# Patient Record
Sex: Female | Born: 1990 | Race: White | Hispanic: No | Marital: Married | Smoking: Never smoker
Health system: Southern US, Community
[De-identification: ages and names within clinical notes are randomized; demographics above are authoritative.]

## PROBLEM LIST (undated history)

## (undated) HISTORY — PX: NO PAST SURGERIES: SHX2092

## (undated) HISTORY — PX: LEEP: SHX91

---

## 2014-09-18 HISTORY — PX: LEEP: SHX91

## 2018-06-19 ENCOUNTER — Emergency Department: Payer: BLUE CROSS/BLUE SHIELD

## 2018-06-19 ENCOUNTER — Other Ambulatory Visit: Payer: Self-pay

## 2018-06-19 ENCOUNTER — Emergency Department
Admission: EM | Admit: 2018-06-19 | Discharge: 2018-06-19 | Disposition: A | Discharge status: Home / Self Care | Payer: BLUE CROSS/BLUE SHIELD | Attending: Emergency Medicine | Admitting: Emergency Medicine

## 2018-06-19 DIAGNOSIS — R109 Unspecified abdominal pain: Secondary | ICD-10-CM | POA: Diagnosis present

## 2018-06-19 DIAGNOSIS — A09 Infectious gastroenteritis and colitis, unspecified: Secondary | ICD-10-CM | POA: Insufficient documentation

## 2018-06-19 LAB — COMPREHENSIVE METABOLIC PANEL
ALBUMIN: 4.5 g/dL (ref 3.5–5.0)
ALT: 28 U/L (ref 0–44)
AST: 23 U/L (ref 15–41)
Alkaline Phosphatase: 51 U/L (ref 38–126)
Anion gap: 11 (ref 5–15)
BILIRUBIN TOTAL: 1 mg/dL (ref 0.3–1.2)
BUN: 8 mg/dL (ref 6–20)
CHLORIDE: 104 mmol/L (ref 98–111)
CO2: 23 mmol/L (ref 22–32)
Calcium: 9 mg/dL (ref 8.9–10.3)
Creatinine, Ser: 0.78 mg/dL (ref 0.44–1.00)
GFR calc Af Amer: >60 mL/min (ref >60)
GFR calc non Af Amer: >60 mL/min (ref >60)
GLUCOSE: 104 mg/dL — AB (ref 70–99)
POTASSIUM: 3.9 mmol/L (ref 3.5–5.1)
Sodium: 138 mmol/L (ref 135–145)
Total Protein: 7.5 g/dL (ref 6.5–8.1)

## 2018-06-19 LAB — CBC
HEMATOCRIT: 38.6 % (ref 35.0–47.0)
Hemoglobin: 13.8 g/dL (ref 12.0–16.0)
MCH: 32.8 pg (ref 26.0–34.0)
MCHC: 35.7 g/dL (ref 32.0–36.0)
MCV: 91.8 fL (ref 80.0–100.0)
Platelets: 169 10*3/uL (ref 150–440)
RBC: 4.2 MIL/uL (ref 3.80–5.20)
RDW: 12.7 % (ref 11.5–14.5)
WBC: 5.9 10*3/uL (ref 3.6–11.0)

## 2018-06-19 LAB — URINALYSIS, COMPLETE (UACMP) WITH MICROSCOPIC
BACTERIA UA: NONE SEEN
BILIRUBIN URINE: NEGATIVE
GLUCOSE, UA: NEGATIVE mg/dL
HGB URINE DIPSTICK: NEGATIVE
Ketones, ur: 20 mg/dL — AB
Leukocytes, UA: NEGATIVE
Nitrite: NEGATIVE
PROTEIN: NEGATIVE mg/dL
Specific Gravity, Urine: 1.01 (ref 1.005–1.030)
pH: 6 (ref 5.0–8.0)

## 2018-06-19 LAB — POCT PREGNANCY, URINE: Preg Test, Ur: NEGATIVE

## 2018-06-19 LAB — LIPASE, BLOOD: LIPASE: 37 U/L (ref 11–51)

## 2018-06-19 IMAGING — CT CT ABD-PELV W/ CM
2 of 4 series · 16 of 46 positions shown, 18 images · IV contrast (APPLIED)
Comparison: None.

CLINICAL DATA: Lower abdominal pain.

EXAM:
CT ABDOMEN AND PELVIS WITH CONTRAST
TECHNIQUE: Multidetector CT imaging of the abdomen and pelvis was performed
using the standard protocol following bolus administration of
intravenous contrast.
CONTRAST:  100mL [id number] IOPAMIDOL ([id number]) INJECTION 61%

[Series 2: routine abd/pel with · axial · 0.72mm/px · z∈[-514,-84]mm · 13 of 94 slices shown, 15 images]
[im 4/94  soft-tissue]
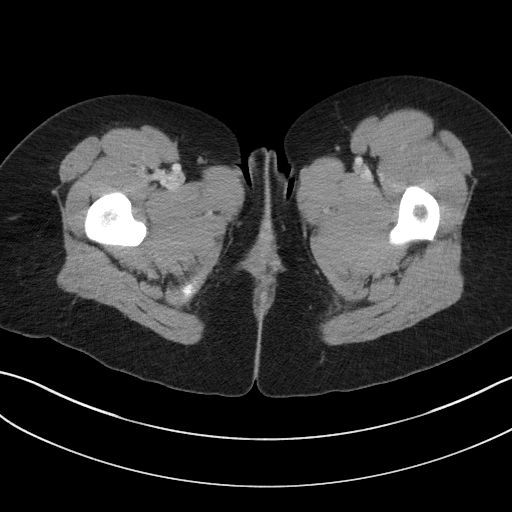
[im 4/94  bone]
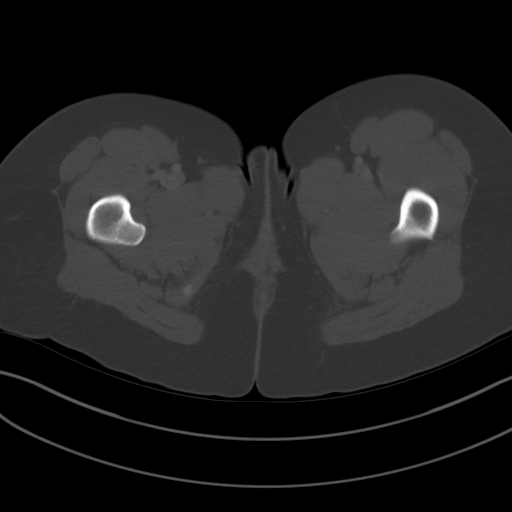
[im 12/94  soft-tissue]
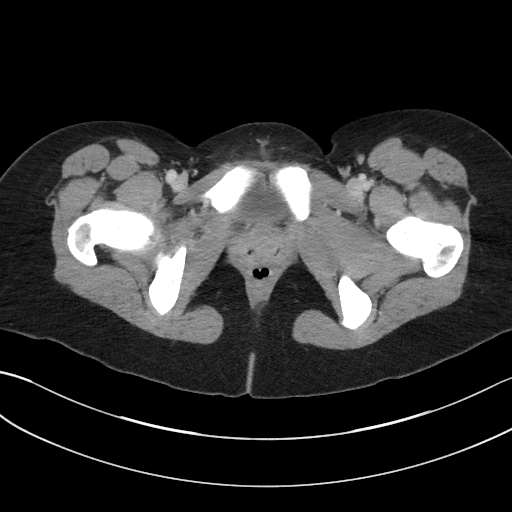
[im 19/94  soft-tissue]
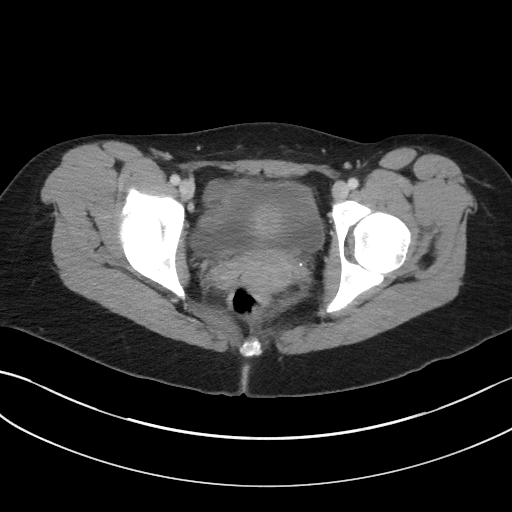
[im 27/94  soft-tissue]
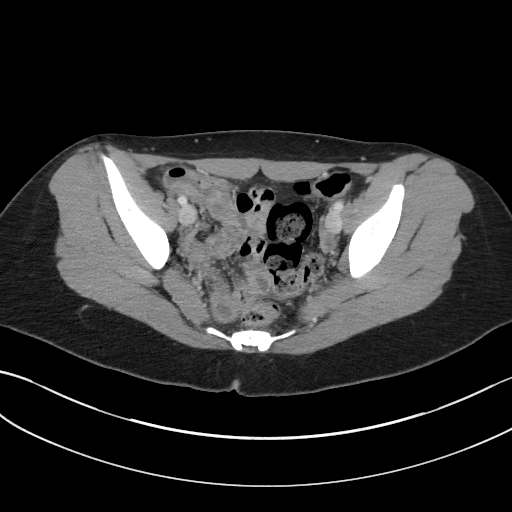
[im 34/94  soft-tissue]
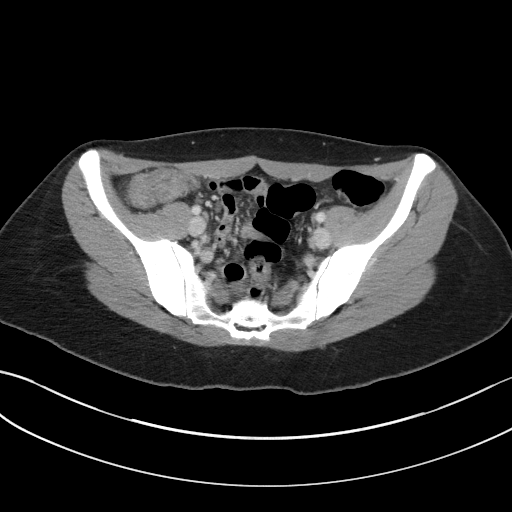
[im 41/94  soft-tissue]
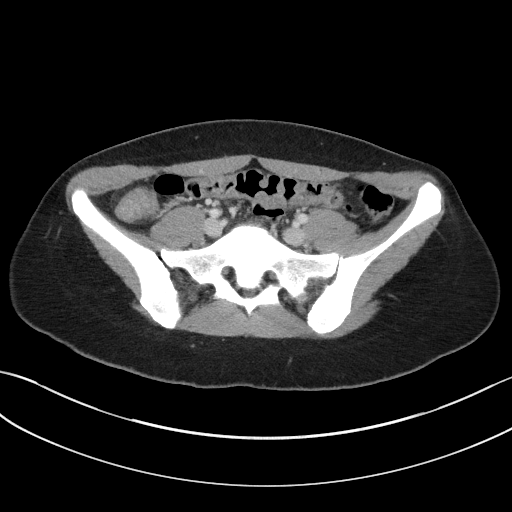
[im 49/94  soft-tissue]
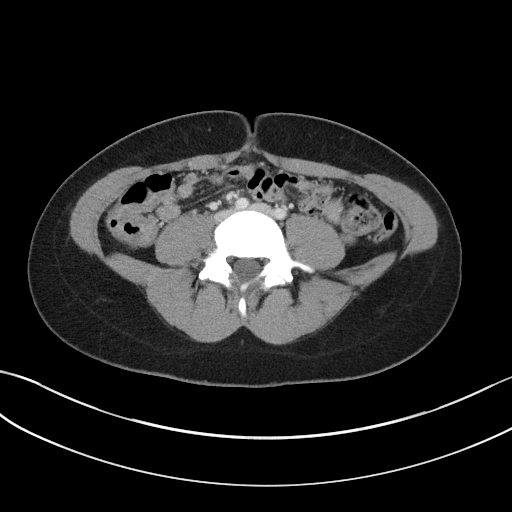
[im 53/94  soft-tissue]
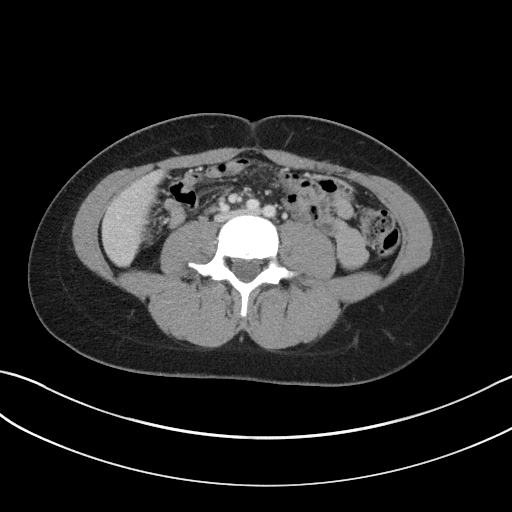
[im 60/94  soft-tissue]
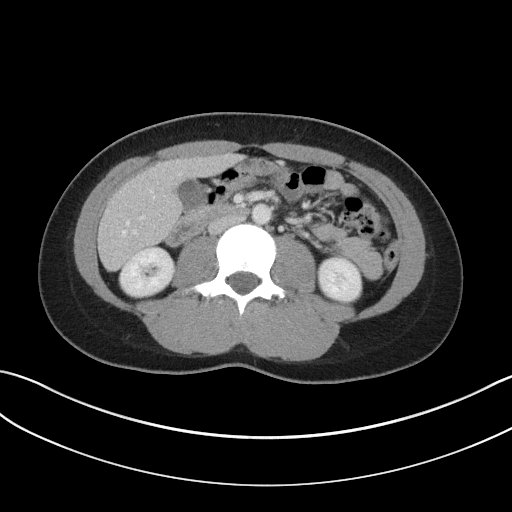
[im 60/94  bone]
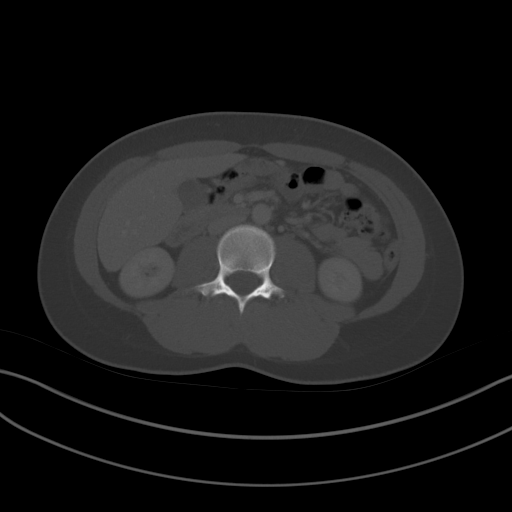
[im 67/94  soft-tissue]
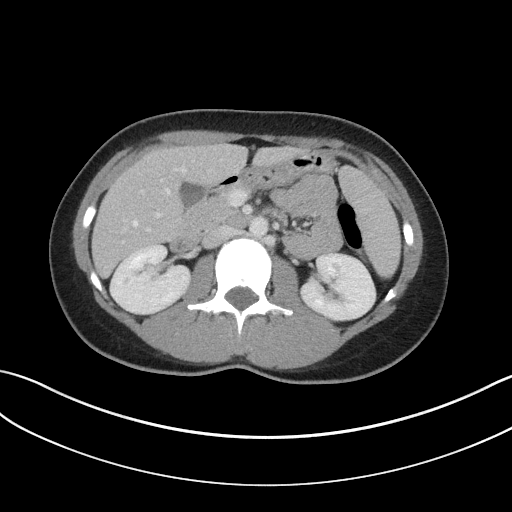
[im 75/94  soft-tissue]
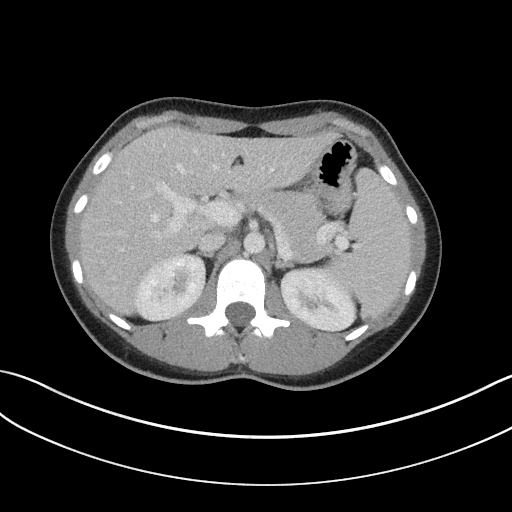
[im 82/94  soft-tissue]
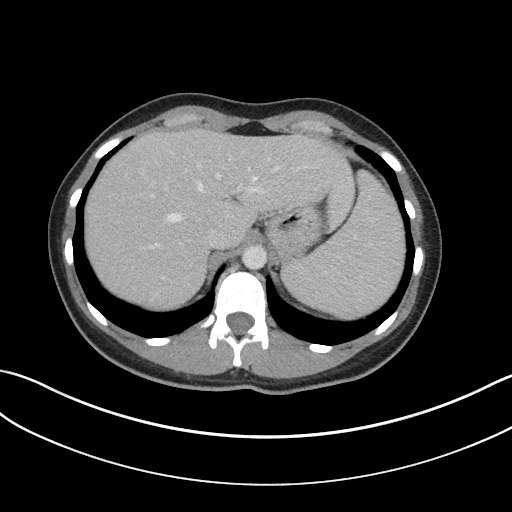
[im 90/94  soft-tissue]
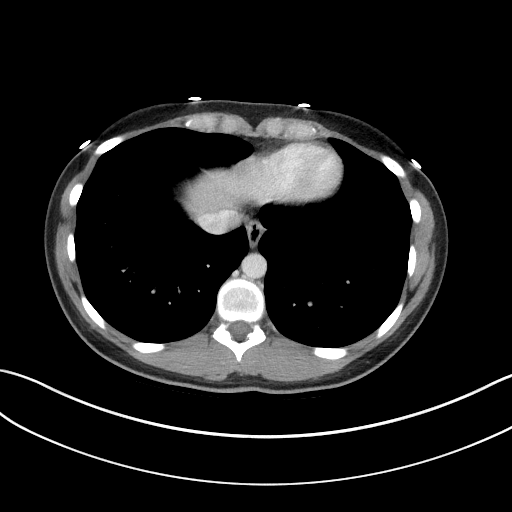

[Series 5: coronal st · coronal · 0.81mm/px · 3 of 68 slices shown]
[im 23/68  soft-tissue]
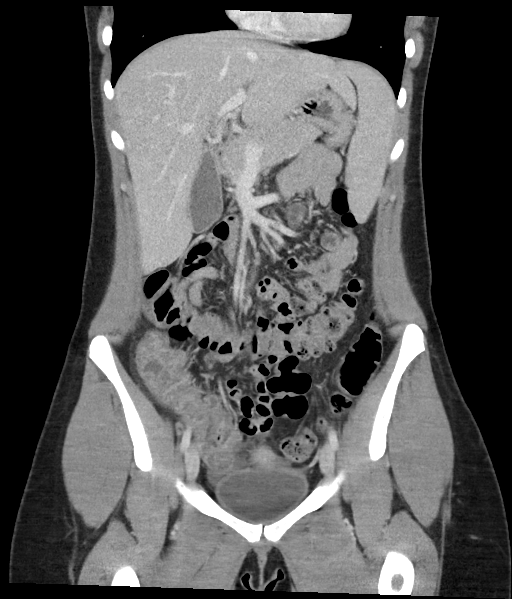
[im 30/68  soft-tissue]
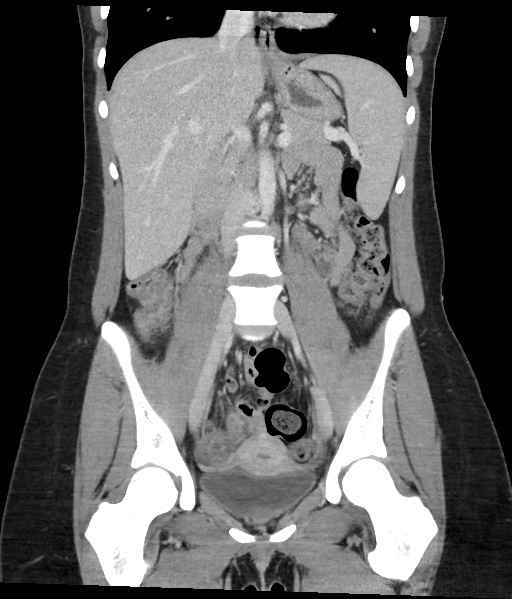
[im 38/68  soft-tissue]
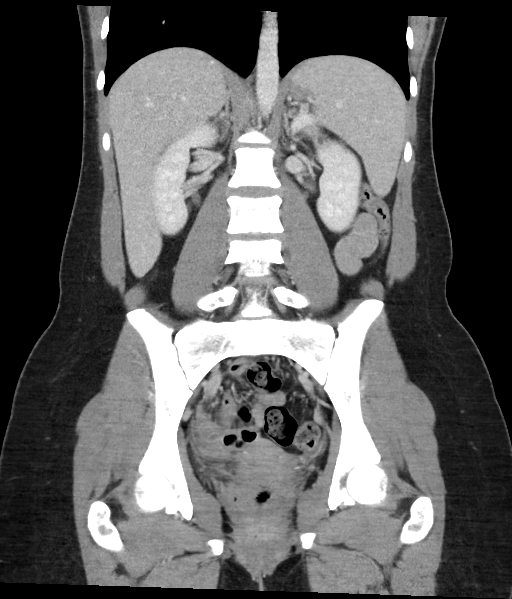

[16 of 46 positions shown; findings below may reference images not displayed]

FINDINGS: Lower chest: Unremarkable.

Hepatobiliary: No focal abnormality within the liver parenchyma.
There is no evidence for gallstones, gallbladder wall thickening, or
pericholecystic fluid. No intrahepatic or extrahepatic biliary
dilation.

Pancreas: No focal mass lesion. No dilatation of the main duct. No
intraparenchymal cyst. No peripancreatic edema.

Spleen: No splenomegaly. No focal mass lesion.

Adrenals/Urinary Tract: No adrenal nodule or mass. Kidneys
unremarkable. No evidence for hydroureter. The urinary bladder
appears normal for the degree of distention.

Stomach/Bowel: Stomach is nondistended. No gastric wall thickening.
No evidence of outlet obstruction. Duodenum is normally positioned
as is the ligament of Treitz. No small bowel dilatation. Small bowel
loops in the right lower quadrant (axial image 69/2) show
circumferential wall thickening, mesenteric congestion with
interloop mesenteric fluid (coronal 42/5). Cecum is decompressed and
anatomy in the region of the ileocecal valve is not well
demonstrated, but the terminal ileum appears thickened as well.
Appendix not visualized. Apparent wall thickening in the right colon
may be related to underdistention.

Vascular/Lymphatic: No abdominal aortic aneurysm. No abdominal
aortic atherosclerotic calcification. There is no gastrohepatic or
hepatoduodenal ligament lymphadenopathy. No intraperitoneal or
retroperitoneal lymphadenopathy.

Reproductive: Uterus unremarkable.  There is no adnexal mass.

Other: Small volume intraperitoneal free fluid noted in the
cul-de-sac.

Musculoskeletal: No worrisome lytic or sclerotic osseous
abnormality.
IMPRESSION: 1. Circumferential wall thickening and edema in the distal ileum,
likely also involving the terminal ileal segment. This is associated
with mesenteric congestion and interloop mesenteric fluid. Imaging
features are compatible with an infectious/inflammatory enteritis.
Right colon is decompressed which may account for the apparent wall
thickening, but a component of associated right colitis not
excluded.

## 2018-06-19 MED ORDER — ONDANSETRON 4 MG PO TBDP: 4.0000 mg | ORAL_TABLET | Freq: Three times a day (TID) | ORAL | 0 refills | Status: DC | PRN

## 2018-06-19 MED ORDER — IOPAMIDOL (ISOVUE-300) INJECTION 61%
100.0000 mL | Freq: Once | INTRAVENOUS | Status: AC | PRN
  Administered 2018-06-19: 100 mL via INTRAVENOUS
  Filled 2018-06-19: qty 100

## 2018-06-19 MED ORDER — AZITHROMYCIN 250 MG PO TABS: ORAL_TABLET | ORAL | 0 refills | Status: DC

## 2018-06-19 MED ORDER — SODIUM CHLORIDE 0.9 % IV BOLUS
1000.0000 mL | Freq: Once | INTRAVENOUS | Status: AC
  Administered 2018-06-19: 1000 mL via INTRAVENOUS

## 2018-06-19 NOTE — ED Provider Notes (Signed)
[facility] Emergency Department Provider Note  ____________________________________________   First MD Initiated Contact with Patient 06/19/18 1136     (approximate)  I have reviewed the triage vital signs and the nursing notes.   HISTORY  Chief Complaint Abdominal Pain    HPI Joanna Adams is a 27 y.o. female  Pt c/o vomiting on Saturday.  diarrhea for 2 days, positive fever/chills, states the pain is located in the left lower quadrant.  She states the pain has spread over across to the mid abdomen.  She states she has a decreased appetite.  She denies any burning with urination, vaginal discharge.  History reviewed. No pertinent past medical history.  There are no active problems to display for this patient.   History reviewed. No pertinent surgical history.  Prior to Admission medications   Medication Sig Start Date End Date Taking? Authorizing Provider  azithromycin (ZITHROMAX Z-PAK) 250 MG tablet 2 pills today then 1 pill a day for 4 days 06/19/18   Sherrie Mustache Roselyn Bering, PA-C  ondansetron (ZOFRAN-ODT) 4 MG disintegrating tablet Take 1 tablet (4 mg total) by mouth every 8 (eight) hours as needed for nausea or vomiting. 06/19/18   Faythe Ghee, PA-C    Allergies Patient has no known allergies.  No family history on file.  Social History Social History   Tobacco Use  . Smoking status: Never Smoker  Substance Use Topics  . Alcohol use: Not Currently  . Drug use: Not on file    Review of Systems  Constitutional: Positive fever/chills Eyes: No visual changes. ENT: No sore throat. Respiratory: Denies cough Gastrointestinal: Positive for vomiting and diarrhea associated with abdominal pain Genitourinary: Negative for dysuria. Musculoskeletal: Negative for back pain. Skin: Negative for rash.    ____________________________________________   PHYSICAL EXAM:  VITAL SIGNS: ED Triage Vitals [06/19/18 0900]  Enc Vitals Group     BP  124/79     Pulse Rate 89     Resp 16     Temp 98.6 F (37 C)     Temp Source Oral     SpO2 96 %     Weight 155 lb (70.3 kg)     Height 5\' 9"  (1.753 m)     Head Circumference      Peak Flow      Pain Score 2     Pain Loc      Pain Edu?      Excl. in GC?     Constitutional: Alert and oriented.  Nontoxic and in no acute distress. Eyes: Conjunctivae are normal.  Head: Atraumatic. Nose: No congestion/rhinnorhea. Mouth/Throat: Mucous membranes are moist.  Throat appears normal Neck:  supple no lymphadenopathy noted Cardiovascular: Normal rate, regular rhythm. Heart sounds are normal Respiratory: Normal respiratory effort.  No retractions, lungs c t a  Abd: soft tender in the lower abdomen in both quads.  No right upper quadrant pain.  Negative Murphy sign.  Negative McBurney's point tenderness.  No rebound tenderness.  GU: deferred Musculoskeletal: FROM all extremities, warm and well perfused Neurologic:  Normal speech and language.  Skin:  Skin is warm, dry and intact. No rash noted. Psychiatric: Mood and affect are normal. Speech and behavior are normal.  ____________________________________________   LABS (all labs ordered are listed, but only abnormal results are displayed)  Labs Reviewed  COMPREHENSIVE METABOLIC PANEL - Abnormal; Notable for the following components:      Result Value   Glucose, Bld 104 (*)  All other components within normal limits  URINALYSIS, COMPLETE (UACMP) WITH MICROSCOPIC - Abnormal; Notable for the following components:   Color, Urine YELLOW (*)    APPearance HAZY (*)    Ketones, ur 20 (*)    All other components within normal limits  LIPASE, BLOOD  CBC  POC URINE PREG, ED  POCT PREGNANCY, URINE   ____________________________________________   ____________________________________________  RADIOLOGY  CT of the abdomen and pelvis shows colitis which could be infectious or  inflammatory.   ____________________________________________   PROCEDURES  Procedure(s) performed: Saline lock, normal saline 1 L IV, Zofran 4 mg IV  Procedures    ____________________________________________   INITIAL IMPRESSION / ASSESSMENT AND PLAN / ED COURSE  Pertinent labs & imaging results that were available during my care of the patient were reviewed by me and considered in my medical decision making (see chart for details).   Patient is a 27 year old female presents emergency department complaining of lower abdominal pain.  She had vomiting on Saturday but none since.  She has had 2 days of diarrhea.  She states she has no appetite and is concerned due to the abdominal pain.  On physical exam she is nontoxic.  The abdomen is soft but tender in the left lower quadrant spreading forward to the mid abdomen.  Bowel sounds are normal all 4 quads.  The remainder the exam is unremarkable.  DDX: Acute appendicitis, acute diverticulitis, acute colitis, acute cholecystitis, viral gastroenteritis  Labs were ordered in triage with comprehensive metabolic panel being normal, lipase is negative, urine pregnant is negative, CBC is normal, urinalysis is normal except for 20 ketones.  CT of the abdomen and pelvis with IV contrast shows colitis.  Due to the sudden onset and the patient's symptoms diagnosis is infectious colitis.  All results were explained in detail to the patient and her husband.  She was given a prescription for Z-Pak and Zofran.  She is to take over-the-counter Imodium right ear as needed.  She is to stay on a clear liquid diet for 24 hours.  She can switch to a bland diet and slowly introduce foods back into her system after that.  They were given strict instructions to return to the emergency department if she is worsening.  She and her husband both state they understand and will comply.  She was discharged in stable condition in the care of her husband.  She was given  a work note.    As part of my medical decision making, I reviewed the following data within the electronic MEDICAL RECORD NUMBER History obtained from family, Nursing notes reviewed and incorporated, Labs reviewed see above, Old chart reviewed, Radiograph reviewed CT of the abdomen pelvis with contrast shows colitis, Notes from prior ED visits and [place] Controlled Substance Database  ____________________________________________   FINAL CLINICAL IMPRESSION(S) / ED DIAGNOSES  Final diagnoses:  Infectious colitis  Acute abdominal pain      NEW MEDICATIONS STARTED DURING THIS VISIT:  Discharge Medication List as of 06/19/2018 12:59 PM    START taking these medications   Details  azithromycin (ZITHROMAX Z-PAK) 250 MG tablet 2 pills today then 1 pill a day for 4 days, Normal         Note:  This document was prepared using Dragon voice recognition software and may include unintentional dictation errors.     Faythe Ghee, PA-C 06/19/18 1630    Emily Filbert, MD 06/20/18 [phone]

## 2018-06-19 NOTE — ED Triage Notes (Signed)
Lower abdominal pain, sharp and more on left than right. Pain began yesterday. Vomiting on Saturday, none since. Diarrhea that started yesterday with abdominal pain. Pt alert and oriented X4, active, cooperative, pt in NAD. RR even and unlabored, color WNL.

## 2018-06-19 NOTE — ED Notes (Signed)
Unable to urinate, given labeled specimen cup and wipe.

## 2018-06-19 NOTE — Discharge Instructions (Signed)
Stay on clear liquids for today and part of tomorrow.  You may start a brat diet after that.  If your abdominal pain is worsening please return to the emergency department.  Use the medication as prescribed.  Tylenol and ibuprofen for pain and fever as needed.  Over-the-counter Imodium right ear if needed for diarrhea.

## 2018-06-19 NOTE — ED Notes (Signed)
First Nurse Note: VS rechecked, urine collected, patient states she would feel better lying down, moved to Sub-wait with husband.

## 2020-08-24 LAB — OB RESULTS CONSOLE HIV ANTIBODY (ROUTINE TESTING): HIV: NONREACTIVE

## 2020-08-24 LAB — OB RESULTS CONSOLE HEPATITIS B SURFACE ANTIGEN: Hepatitis B Surface Ag: NEGATIVE

## 2020-08-24 LAB — OB RESULTS CONSOLE RPR: RPR: NONREACTIVE

## 2020-08-24 LAB — OB RESULTS CONSOLE VARICELLA ZOSTER ANTIBODY, IGG: Varicella: NON-IMMUNE/NOT IMMUNE

## 2020-08-24 LAB — OB RESULTS CONSOLE RUBELLA ANTIBODY, IGM: Rubella: NON-IMMUNE/NOT IMMUNE

## 2020-09-18 NOTE — L&D Delivery Note (Signed)
Delivery Note  Date of delivery: 02/21/2021 Estimated Date of Delivery: 03/13/21 No LMP recorded. EGA: [redacted]w[redacted]d  Delivery Note At 6:45 AM a viable female was delivered via Vaginal, Spontaneous (Presentation: Left Occiput Anterior).  APGAR: 7, 9; weight pending.  Placenta status: Spontaneous, Intact.  Cord: 3 vessels with the following complications: None.     First Stage: Labor onset: unknown Augmentation : Pitocin, AROM, foley bulb, Cytotec Analgesia Eliezer Lofts intrapartum: Epidural AROM at [employer] presented to L&D for IOL for pre-e without severe features. She was augmented with pitocin. Epidural placed for pain relief.   Second Stage: Complete dilation at 0445 Onset of pushing at 0524 FHR second stage Cat I Delivery at 0645 on 02/21/2021  She progressed to complete and had a spontaneous vaginal birth of a live female over an intact perineum. The fetal head was delivered in OA position with restitution to LOA. Posterior shoulder dystocia for 30 sec after anterior shoulder was out.  McRoberts used with resolution.  No nuchal cord. Baby placed on mom's abdomen and attended to by transition RN. Cord clamped and cut immediately by FOB.   Third Stage: Placenta delivered intact with 3VC at [phone] Placenta disposition: routine disposal Uterine tone firm / bleeding min IV pitocin given for hemorrhage prophylaxis  Anesthesia: None;Epidural Episiotomy: None Lacerations: 2nd degree;Vaginal;Periurethral Suture Repair: 2.0 3.0 vicryl Est. Blood Loss (mL): 150   Complications: short posterior shoulder dystocia  Mom to postpartum.  Baby to Couplet care / Skin to Skin.  Newborn: Birth Weight: pending  Apgar Scores: 7, 9 Feeding planned: breastfeeding   Cyril Mourning, CNM 02/21/2021 7:35 AM

## 2020-10-18 ENCOUNTER — Other Ambulatory Visit: Payer: Self-pay

## 2020-10-18 ENCOUNTER — Emergency Department: Payer: BC Managed Care – PPO

## 2020-10-18 ENCOUNTER — Emergency Department
Admission: EM | Admit: 2020-10-18 | Discharge: 2020-10-18 | Disposition: A | Discharge status: Home / Self Care | Payer: BC Managed Care – PPO | Attending: Emergency Medicine | Admitting: Emergency Medicine

## 2020-10-18 ENCOUNTER — Encounter: Payer: Self-pay | Admitting: *Deleted

## 2020-10-18 DIAGNOSIS — Z3A19 19 weeks gestation of pregnancy: Secondary | ICD-10-CM | POA: Insufficient documentation

## 2020-10-18 DIAGNOSIS — O26891 Other specified pregnancy related conditions, first trimester: Secondary | ICD-10-CM | POA: Insufficient documentation

## 2020-10-18 DIAGNOSIS — R55 Syncope and collapse: Secondary | ICD-10-CM

## 2020-10-18 LAB — CBC
HCT: 33.8 % — ABNORMAL LOW (ref 36.0–46.0)
Hemoglobin: 11.8 g/dL — ABNORMAL LOW (ref 12.0–15.0)
MCH: 31.6 pg (ref 26.0–34.0)
MCHC: 34.9 g/dL (ref 30.0–36.0)
MCV: 90.4 fL (ref 80.0–100.0)
Platelets: 198 10*3/uL (ref 150–400)
RBC: 3.74 MIL/uL — ABNORMAL LOW (ref 3.87–5.11)
RDW: 13.4 % (ref 11.5–15.5)
WBC: 9.7 10*3/uL (ref 4.0–10.5)
nRBC: 0 % (ref 0.0–0.2)

## 2020-10-18 LAB — BASIC METABOLIC PANEL
Anion gap: 11 (ref 5–15)
BUN: 6 mg/dL (ref 6–20)
CO2: 21 mmol/L — ABNORMAL LOW (ref 22–32)
Calcium: 9 mg/dL (ref 8.9–10.3)
Chloride: 107 mmol/L (ref 98–111)
Creatinine, Ser: 0.43 mg/dL — ABNORMAL LOW (ref 0.44–1.00)
GFR, Estimated: >60 mL/min (ref >60)
Glucose, Bld: 84 mg/dL (ref 70–99)
Potassium: 3.9 mmol/L (ref 3.5–5.1)
Sodium: 139 mmol/L (ref 135–145)

## 2020-10-18 NOTE — ED Notes (Signed)
Attempt to doppler baby, unable as doppler is defective.  Checked for second doppler without success.  Checked with EDP who ordered US

## 2020-10-18 NOTE — Discharge Instructions (Signed)
As we discussed, please ensure you are eating and drinking plenty of fluids to maintain your hydration. Frequent small snacks and sips of water to be helpful.  Joanna Adams looks happy and healthy on this ultrasound. Please follow-up with your OB/GYN later this week for your more thorough formal ultrasound.

## 2020-10-18 NOTE — ED Provider Notes (Signed)
[facility] Emergency Department Provider Note ____________________________________________   Event Date/Time   First MD Initiated Contact with Patient 10/18/20 1303     (approximate)  I have reviewed the triage vital signs and the nursing notes.  HISTORY  Chief Complaint Loss of Consciousness ([redacted] weeks pregnant)   HPI Joanna Adams is a 30 y.o. femalewho presents to the ED for evaluation of dizziness and syncopal episode.   Chart review indicates G1 at [redacted] weeks gestation.  Follows with [facility]  Patient presents to the ED with her mother, who provides additional history, due to a syncopal episode that occurred last night. Patient reports getting up from sleep to void, she felt lightheaded dizziness, and fell to the ground. She denies any preceding chest pain, palpitations, headache, and reports that she has been at her baseline since recovering from this episode. She reports intermittent LLQ abdominal pain at baseline, and she self reports this is likely due to round ligament pain. She called her OB/GYN to get an appointment this week, but after discussing this episode, they referred to the ED for evaluation.  Patient denies any acute abdominal pain, vaginal bleeding, loss of fluids or other new discharge, emesis, fever, chest or back pain.   History reviewed. No pertinent past medical history.  There are no problems to display for this patient.   Past Surgical History:  Procedure Laterality Date  . LEEP      Prior to Admission medications   Medication Sig Start Date End Date Taking? Authorizing Provider  azithromycin (ZITHROMAX Z-PAK) 250 MG tablet 2 pills today then 1 pill a day for 4 days 06/19/18   Sherrie Mustache Roselyn Bering, PA-C  ondansetron (ZOFRAN-ODT) 4 MG disintegrating tablet Take 1 tablet (4 mg total) by mouth every 8 (eight) hours as needed for nausea or vomiting. 06/19/18   Faythe Ghee, PA-C    Allergies Patient has no known  allergies.  No family history on file.  Social History Social History   Tobacco Use  . Smoking status: Never Smoker  . Smokeless tobacco: Never Used  Substance Use Topics  . Alcohol use: Not Currently    Review of Systems  Constitutional: No fever/chills Eyes: No visual changes. ENT: No sore throat. Cardiovascular: Denies chest pain. Respiratory: Denies shortness of breath. Gastrointestinal:  No nausea, no vomiting.  No diarrhea.  No constipation. LLQ pain Genitourinary: Negative for dysuria. Musculoskeletal: Negative for back pain. Skin: Negative for rash. Neurological: Negative for headaches, focal weakness or numbness. Syncope  ____________________________________________   PHYSICAL EXAM:  VITAL SIGNS: Vitals:   10/18/20 1120 10/18/20 1303  BP: 117/69 112/60  Pulse: 85 82  Resp: 14 18  Temp: 98.7 F (37.1 C) 98.5 F (36.9 C)  SpO2: 98% 98%     Constitutional: Alert and oriented. Well appearing and in no acute distress. Eyes: Conjunctivae are normal. PERRL. EOMI. Head: Atraumatic. Nose: No congestion/rhinnorhea. Mouth/Throat: Mucous membranes are moist.  Oropharynx non-erythematous. Neck: No stridor. No cervical spine tenderness to palpation. Cardiovascular: Normal rate, regular rhythm. Grossly normal heart sounds.  Good peripheral circulation. Respiratory: Normal respiratory effort.  No retractions. Lungs CTAB. Gastrointestinal: Soft , gravid, nontender to palpation. No CVA tenderness. Musculoskeletal: No lower extremity tenderness nor edema.  No joint effusions. No signs of acute trauma. Neurologic:  Normal speech and language. No gross focal neurologic deficits are appreciated. No gait instability noted. Skin:  Skin is warm, dry and intact. No rash noted. Psychiatric: Mood and affect are normal. Speech and  behavior are normal.  ____________________________________________   LABS (all labs ordered are listed, but only abnormal results are  displayed)  Labs Reviewed  BASIC METABOLIC PANEL - Abnormal; Notable for the following components:      Result Value   CO2 21 (*)    Creatinine, Ser 0.43 (*)    All other components within normal limits  CBC - Abnormal; Notable for the following components:   RBC 3.74 (*)    Hemoglobin 11.8 (*)    HCT 33.8 (*)    All other components within normal limits  URINALYSIS, COMPLETE (UACMP) WITH MICROSCOPIC  CBG MONITORING, ED   ____________________________________________  12 Lead EKG  Sinus rhythm, rate of 85 bpm.  Normal axis and intervals.  No evidence of acute ischemia.  Wandering baseline. ____________________________________________  RADIOLOGY  ED MD interpretation: Limited OB ultrasound reviewed by me with IUP in place without evidence of traumatic pathology.  Official radiology report(s): US OB Limited  Result Date: 10/18/2020 CLINICAL DATA:  Abdominal pain after fall EXAM: LIMITED OBSTETRIC ULTRASOUND AND TRANSVAGINAL OBSTETRIC ULTRASOUND FINDINGS: Number of Fetuses: 1 Heart Rate:  144 bpm Movement: Yes Presentation: Cephalic Placental Location: Posterior Previa: No Amniotic Fluid (Subjective): Within normal limits. Maximum vertical pocket: 4.0 cm FL:  3.0cm 19w 2d MATERNAL FINDINGS: Cervix:  Appears closed measures 2.6 cm in length. Uterus/Adnexae:  No abnormality visualized. IMPRESSION: 1. Single live intrauterine gestation in cephalic presentation measuring 19 weeks 2 days by femur length. 2. Active fetal heart tones at 144 bpm. 3. Shortened cervical length of 2.6 cm. This exam is performed on an emergent basis and does not comprehensively evaluate fetal size, dating, or anatomy; follow-up complete OB US should be considered if further fetal assessment is warranted. Electronically Signed   By: Duanne Guess D.O.   On: 10/18/2020 12:42    ____________________________________________   PROCEDURES and INTERVENTIONS  Procedure(s) performed (including Critical Care):  .1-3  Lead EKG Interpretation Performed by: Delton Prairie, MD Authorized by: Delton Prairie, MD     Interpretation: normal     ECG rate:  80   ECG rate assessment: normal     Rhythm: sinus rhythm     Ectopy: none     Conduction: normal      Medications - No data to display  ____________________________________________   MDM / ED COURSE   30 year old G1 at 42 gestation presents to the ED after syncopal episode that occurred overnight, likely vasovagal in etiology, and amenable to outpatient management. Normal vitals on room air. Exam reassuring without evidence of trauma, neurovascular deficits, abdominal tenderness. Blood work with slight decrease in bicarb, likely representing her poor p.o. intake and slight dehydration, further precipitating her vasovagal episode. Obstetric ultrasound without evidence of traumatic pathology such as subchorionic hemorrhage or abruption. Patient tolerating p.o. intake here in the ED, and has improving symptoms with this. We discussed following up with her OB/GYN and we discussed return precautions for the ED. Patient medically stable for discharge home.      ____________________________________________   FINAL CLINICAL IMPRESSION(S) / ED DIAGNOSES  Final diagnoses:  Vasovagal episode  [redacted] weeks gestation of pregnancy     ED Discharge Orders    None       Adriaan Maltese Katrinka Blazing   Note:  This document was prepared using Conservation officer, historic buildings and may include unintentional dictation errors.   Delton Prairie, MD 10/18/20 [phone]

## 2020-10-18 NOTE — ED Triage Notes (Signed)
Pt is [redacted] weeks pregnant.  Her due date is 03/13/21 and she has been having dizziness her entire pregnancy.  The dizziness varies but is worst when getting up at night and first thing in the morning.  Last night she got up to go to the bathroom to void when she passed out briefly.  Pt states that she was fine after the syncope and denies any CP or sob with this CP.  Pt then noted later this am that she was having LLQ pain, the pain is constant and not tender to touch.  Pt contacted her OB and he advised her to come to the ER.  Pt states that she is not sure about baby moving as this is her first pregnancy (she was unsure if she is feeling her baby as she has not began noting that). No bleeding or vaginal discharge.  Pt did not strike her abdomen when she fell.

## 2020-12-06 ENCOUNTER — Observation Stay
Admission: EM | Admit: 2020-12-06 | Discharge: 2020-12-06 | Disposition: A | Discharge status: Home / Self Care | Payer: BC Managed Care – PPO | Attending: Obstetrics and Gynecology | Admitting: Obstetrics and Gynecology

## 2020-12-06 ENCOUNTER — Other Ambulatory Visit: Payer: Self-pay

## 2020-12-06 DIAGNOSIS — O368120 Decreased fetal movements, second trimester, not applicable or unspecified: Secondary | ICD-10-CM | POA: Diagnosis present

## 2020-12-06 DIAGNOSIS — O368130 Decreased fetal movements, third trimester, not applicable or unspecified: Principal | ICD-10-CM | POA: Insufficient documentation

## 2020-12-06 DIAGNOSIS — Z3A26 26 weeks gestation of pregnancy: Secondary | ICD-10-CM | POA: Insufficient documentation

## 2020-12-06 NOTE — Discharge Summary (Signed)
Joanna Adams is a 30 y.o. female. She is at 68w1dgestation. No LMP recorded. Patient is pregnant. 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 Varicella and rubella non-immune  Needs varivax and MMR postpartum   Chief complaint: decreased fetal movement.    S: Resting comfortably. no CTX, no VB.no LOF,  Active fetal movement now while in triage. Denies: HA, visual changes, SOB, or RUQ/epigastric pain  Maternal Medical History:  History reviewed. No pertinent past medical history.  Past Surgical History:  Procedure Laterality Date  . LEEP    . NO PAST SURGERIES      No Known Allergies  Prior to Admission medications   Medication Sig Start Date End Date Taking? Authorizing Provider  Prenatal Vit-Fe Fumarate-FA (PRENATAL MULTIVITAMIN) TABS tablet Take 1 tablet by mouth daily at 12 noon.   Yes [provider]  azithromycin (ZITHROMAX Z-PAK) 250 MG tablet 2 pills today then 1 pill a day for 4 days Patient not taking: Reported on 12/06/2020 06/19/18   FVersie Starks PA-C  ondansetron (ZOFRAN-ODT) 4 MG disintegrating tablet Take 1 tablet (4 mg total) by mouth every 8 (eight) hours as needed for nausea or vomiting. Patient not taking: Reported on 12/06/2020 06/19/18   FVersie Starks PA-C      Social History: She  reports that she has never smoked. She has never used smokeless tobacco. She reports previous alcohol use. She reports that she does not use drugs.  Family History:  no history of gyn cancers  Review of Systems: A full review of systems was performed and negative except as noted in the HPI.     O:  BP 113/71 (BP Location: Left Arm)   Pulse 91   Temp 98.4 F (36.9 C) (Oral)   Resp 16   Ht 5' 9"  (1.753 m)   Wt 87.1 kg    BMI 28.35 kg/m  No results found for this or any previous visit (from the past 48 hour(s)).   Constitutional: NAD, AAOx3  HE/ENT: extraocular movements grossly intact, moist mucous membranes CV: RRR PULM: nl respiratory effort, CTABL     Abd: gravid, non-tender, non-distended, soft      Ext: Non-tender, Nonedematous   Psych: mood appropriate, speech normal Pelvic: deferred  Fetal  monitoring: Cat I Appropriate for GA Baseline:  150bpm Variability: moderate Accelerations:  present x >2 Decelerations absent Time 258ms  Bedside AFI performed, cephalic presentation, AFI 16cm, WNL  A/P: 89.o. 2662w1dre for antenatal surveillance for DFM  Principle Diagnosis: Decreased fetal movement   Fetal Wellbeing: Reassuring Cat 1 tracing.  Reactive NST   D/c home stable, precautions reviewed, follow-up as scheduled.    RebFrancetta FoundNM 12/08/2020  5:29 PM

## 2020-12-06 NOTE — OB Triage Note (Signed)
Pt is a G1P0 at [redacted]w[redacted]d that presents from ED with c/o decreased FM since yesterday. Pt states she can feel the baby moving now and denies VB, LOF. EFM applied and initial FHT 155.

## 2020-12-06 NOTE — OB Triage Note (Signed)
Patient Discharged home per provider. Pt educated about kick counts and informed when to return to the ED for further evaluation. Pt instructed to keep all follow up appointments with her provider. AVS given to patient and RN answered all questions and patient has no further questions at this time. Pt discharged home in stable condition with significant other.

## 2021-01-25 ENCOUNTER — Observation Stay
Admission: EM | Admit: 2021-01-25 | Discharge: 2021-01-25 | Disposition: A | Discharge status: Home / Self Care | Payer: BC Managed Care – PPO | Attending: Obstetrics and Gynecology | Admitting: Obstetrics and Gynecology

## 2021-01-25 ENCOUNTER — Other Ambulatory Visit: Payer: Self-pay

## 2021-01-25 ENCOUNTER — Encounter: Payer: Self-pay | Admitting: Obstetrics and Gynecology

## 2021-01-25 DIAGNOSIS — N898 Other specified noninflammatory disorders of vagina: Secondary | ICD-10-CM | POA: Diagnosis present

## 2021-01-25 DIAGNOSIS — Z3A33 33 weeks gestation of pregnancy: Secondary | ICD-10-CM | POA: Insufficient documentation

## 2021-01-25 DIAGNOSIS — O4703 False labor before 37 completed weeks of gestation, third trimester: Secondary | ICD-10-CM | POA: Insufficient documentation

## 2021-01-25 DIAGNOSIS — O26893 Other specified pregnancy related conditions, third trimester: Secondary | ICD-10-CM | POA: Diagnosis not present

## 2021-01-25 LAB — WET PREP, GENITAL
Clue Cells Wet Prep HPF POC: NONE SEEN
Sperm: NONE SEEN
Trich, Wet Prep: NONE SEEN
Yeast Wet Prep HPF POC: NONE SEEN

## 2021-01-25 NOTE — Discharge Summary (Signed)
Rn reviewed discharge instructions with patient. Gave pt opportunity for questions. All questions answered at this time. Pt verbalized understanding. Pt discharged home  

## 2021-01-25 NOTE — OB Triage Note (Signed)
Presents after calling MD office and advised to come in for complaint of contractions that are infrequent but feel like menstrual cramps. Pt states that she lost part of her mucous plug on Saturday and some more on Sunday. Denies any bleeding or leaking of fluid. Also denies any urinary urgency , burning or frequency. EFMs applied.

## 2021-01-25 NOTE — OB Triage Note (Signed)
Pt presents c/o loosing her mucous plug. Pt denies LOF. Reports a little cramping. Reports positive fetal movement. VSS. Will continue to monitor.

## 2021-01-25 NOTE — Discharge Summary (Signed)
Joanna Adams is a 30 y.o. female. She is at 73w2dgestation. No LMP recorded. Patient is pregnant. Estimated Date of Delivery: 03/13/21  [facility] Current pregnancy complicated by:  1. 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 Varicella and rubella non-immune  Needs varivax and MMR postpartum  Chief complaint:IC on Saturday, lost mucus over several days. Uterine cramping noted off and on, mostly tightness, feels more strongly in evenings and when walking around. UCs stop when laying down. NO LOF or VB. Does have discharge.   S: Resting comfortably. no CTX, no VB.no LOF,  Active fetal movement. Denies: HA, visual changes, SOB, or RUQ/epigastric pain  Maternal Medical History:  History reviewed. No pertinent past medical history.  Past Surgical History:  Procedure Laterality Date  . LEEP    . NO PAST SURGERIES      No Known Allergies  Prior to Admission medications   Medication Sig Start Date End Date Taking? Authorizing Provider  famotidine (PEPCID) 10 MG tablet Take 10 mg by mouth 2 (two) times daily.   Yes [provider]  loratadine (CLARITIN) 10 MG tablet Take 10 mg by mouth daily.   Yes [provider]  azithromycin (ZITHROMAX Z-PAK) 250 MG tablet 2 pills today then 1 pill a day for 4 days Patient not taking: Reported on 12/06/2020 06/19/18   FVersie Starks PA-C  ondansetron (ZOFRAN-ODT) 4 MG disintegrating tablet Take 1 tablet (4 mg total) by mouth every 8 (eight) hours as needed for nausea or vomiting. Patient not taking: Reported on 12/06/2020 06/19/18   FVersie Starks PA-C  Prenatal Vit-Fe Fumarate-FA (PRENATAL MULTIVITAMIN) TABS tablet Take 1 tablet by mouth daily at 12 noon.    [provider]      Social History: She  reports that she has never smoked. She has never used smokeless  tobacco. She reports previous alcohol use. She reports that she does not use drugs.  Family History:  no history of gyn cancers  Review of Systems: A full review of systems was performed and negative except as noted in the HPI.     O:  Temp 98 F (36.7 C) (Oral)   Resp 16   Ht 5' 9"  (1.753 m)   Wt 94.8 kg   SpO2 99%   BMI 30.86 kg/m  No results found for this or any previous visit (from the past 48 hour(s)).   Constitutional: NAD, AAOx3  HE/ENT: extraocular movements grossly intact, moist mucous membranes CV: RRR PULM: nl respiratory effort, CTABL     Abd: gravid, non-tender, non-distended, soft      Ext: Non-tender, Nonedematous   Psych: mood appropriate, speech normal Pelvic: SSE done  Pelvic exam- closed/long cervix, no bleeding, no friability. No cervical mucus noted. Small amount white vaginal dc noted.   Fetal  monitoring: Cat I Appropriate for GA Baseline: 135bpm Variability: moderate Accelerations: present x >2 Decelerations absent   TOCO: q5-130m, palp mild.     A/P: 29107.o. 3323w2dre for antenatal surveillance for vaginal discharge and BH contractions  Principle Diagnosis:  vaginal discharge and BH contractions, 33wks    Preterm labor: not present.   Fetal Wellbeing: Reassuring Cat 1 tracing.  Reactive NST   Wet prep pending  D/c home stable, precautions reviewed, follow-up as scheduled.    RebWells Guiles  A Amario Longmore, CNM 01/25/2021  12:29 PM

## 2021-02-07 ENCOUNTER — Encounter: Payer: Self-pay | Admitting: Obstetrics and Gynecology

## 2021-02-07 ENCOUNTER — Other Ambulatory Visit: Payer: Self-pay | Admitting: Obstetrics and Gynecology

## 2021-02-07 ENCOUNTER — Other Ambulatory Visit: Payer: Self-pay

## 2021-02-07 ENCOUNTER — Observation Stay
Admission: EM | Admit: 2021-02-07 | Discharge: 2021-02-07 | Disposition: A | Discharge status: Home / Self Care | Payer: BC Managed Care – PPO | Attending: Obstetrics and Gynecology | Admitting: Obstetrics and Gynecology

## 2021-02-07 DIAGNOSIS — B069 Rubella without complication: Secondary | ICD-10-CM | POA: Insufficient documentation

## 2021-02-07 DIAGNOSIS — Z3A35 35 weeks gestation of pregnancy: Secondary | ICD-10-CM | POA: Diagnosis not present

## 2021-02-07 DIAGNOSIS — O1493 Unspecified pre-eclampsia, third trimester: Principal | ICD-10-CM | POA: Insufficient documentation

## 2021-02-07 DIAGNOSIS — O98513 Other viral diseases complicating pregnancy, third trimester: Secondary | ICD-10-CM | POA: Diagnosis not present

## 2021-02-07 DIAGNOSIS — B019 Varicella without complication: Secondary | ICD-10-CM | POA: Insufficient documentation

## 2021-02-07 DIAGNOSIS — O139 Gestational [pregnancy-induced] hypertension without significant proteinuria, unspecified trimester: Secondary | ICD-10-CM | POA: Diagnosis present

## 2021-02-07 DIAGNOSIS — O26893 Other specified pregnancy related conditions, third trimester: Secondary | ICD-10-CM | POA: Diagnosis present

## 2021-02-07 LAB — CBC WITH DIFFERENTIAL/PLATELET
Abs Immature Granulocytes: 0.16 10*3/uL — ABNORMAL HIGH (ref 0.00–0.07)
Basophils Absolute: 0 10*3/uL (ref 0.0–0.1)
Basophils Relative: 0 %
Eosinophils Absolute: 0.1 10*3/uL (ref 0.0–0.5)
Eosinophils Relative: 1 %
HCT: 35.8 % — ABNORMAL LOW (ref 36.0–46.0)
Hemoglobin: 12.1 g/dL (ref 12.0–15.0)
Immature Granulocytes: 2 %
Lymphocytes Relative: 17 %
Lymphs Abs: 1.7 10*3/uL (ref 0.7–4.0)
MCH: 31.7 pg (ref 26.0–34.0)
MCHC: 33.8 g/dL (ref 30.0–36.0)
MCV: 93.7 fL (ref 80.0–100.0)
Monocytes Absolute: 0.6 10*3/uL (ref 0.1–1.0)
Monocytes Relative: 6 %
Neutro Abs: 7.4 10*3/uL (ref 1.7–7.7)
Neutrophils Relative %: 74 %
Platelets: 183 10*3/uL (ref 150–400)
RBC: 3.82 MIL/uL — ABNORMAL LOW (ref 3.87–5.11)
RDW: 13.6 % (ref 11.5–15.5)
WBC: 9.9 10*3/uL (ref 4.0–10.5)
nRBC: 0 % (ref 0.0–0.2)

## 2021-02-07 LAB — COMPREHENSIVE METABOLIC PANEL
ALT: 12 U/L (ref 0–44)
AST: 19 U/L (ref 15–41)
Albumin: 3.1 g/dL — ABNORMAL LOW (ref 3.5–5.0)
Alkaline Phosphatase: 102 U/L (ref 38–126)
Anion gap: 10 (ref 5–15)
BUN: 8 mg/dL (ref 6–20)
CO2: 22 mmol/L (ref 22–32)
Calcium: 9 mg/dL (ref 8.9–10.3)
Chloride: 105 mmol/L (ref 98–111)
Creatinine, Ser: 0.59 mg/dL (ref 0.44–1.00)
GFR, Estimated: >60 mL/min (ref >60)
Glucose, Bld: 96 mg/dL (ref 70–99)
Potassium: 4.2 mmol/L (ref 3.5–5.1)
Sodium: 137 mmol/L (ref 135–145)
Total Bilirubin: 0.6 mg/dL (ref 0.3–1.2)
Total Protein: 6.3 g/dL — ABNORMAL LOW (ref 6.5–8.1)

## 2021-02-07 LAB — PROTEIN / CREATININE RATIO, URINE
Creatinine, Urine: 35 mg/dL
Protein Creatinine Ratio: 0.26 mg/mg{Cre} — ABNORMAL HIGH (ref 0.00–0.15)
Total Protein, Urine: 9 mg/dL

## 2021-02-07 MED ORDER — LABETALOL HCL 5 MG/ML IV SOLN: 80.0000 mg | INTRAVENOUS | Status: DC | PRN

## 2021-02-07 MED ORDER — HYDRALAZINE HCL 20 MG/ML IJ SOLN: 10.0000 mg | INTRAMUSCULAR | Status: DC | PRN

## 2021-02-07 MED ORDER — LABETALOL HCL 5 MG/ML IV SOLN: 20.0000 mg | INTRAVENOUS | Status: DC | PRN

## 2021-02-07 MED ORDER — LABETALOL HCL 5 MG/ML IV SOLN: 40.0000 mg | INTRAVENOUS | Status: DC | PRN

## 2021-02-07 MED ORDER — LACTATED RINGERS IV BOLUS
500.0000 mL | Freq: Once | INTRAVENOUS | Status: AC
  Administered 2021-02-07: 500 mL via INTRAVENOUS

## 2021-02-07 MED ORDER — LACTATED RINGERS IV SOLN: INTRAVENOUS | Status: DC

## 2021-02-07 MED ORDER — ACETAMINOPHEN 325 MG PO TABS
650.0000 mg | ORAL_TABLET | ORAL | Status: DC | PRN
Start: 2021-02-07 — End: 2021-02-08
  Filled 2021-02-07: qty 2

## 2021-02-07 NOTE — OB Triage Note (Signed)
Pt d/c home in good condition by self. RN and Midwife at bedside for discharge instructions including taking tylenol for headache, taking tomorrow off work, continue to check BP's at home/work, and follow up in office at next apt. Pt additionally instructed to come back with increased ctx, LOF, vag bleeding, or decreased FM. Pt verbalized understanding. VSS.

## 2021-02-07 NOTE — Discharge Summary (Signed)
Patient ID: Joanna Adams MRN: [id number] DOB/AGE: [birth year] 29 y.o.  Admit date: 02/07/2021 Discharge date: 02/07/2021  Admission Diagnoses: 30yo G1P0 at [redacted]w[redacted]d called the office with c/o increased home BPs, mild HA for several days, dizziness and seeing spots starting today - sent to triage at the hospital.    Factors complicating this pregnancy:  1. Preeclampsia without severe features  2. Hx of abnormal pap smears  3. Varicella and rubella non-immune  Discharge Diagnoses: Pre-E without severe features  Prenatal Procedures: NST  Consults: None  Significant Diagnostic Studies:  Results for orders placed or performed during the hospital encounter of 02/07/21 (from the past 168 hour(s))  Comprehensive metabolic panel   Collection Time: 02/07/21  4:11 PM  Result Value Ref Range   Sodium 137 135 - 145 mmol/L   Potassium 4.2 3.5 - 5.1 mmol/L   Chloride 105 98 - 111 mmol/L   CO2 22 22 - 32 mmol/L   Glucose, Bld 96 70 - 99 mg/dL   BUN 8 6 - 20 mg/dL   Creatinine, Ser 7.42 0.44 - 1.00 mg/dL   Calcium 9.0 8.9 - 59.5 mg/dL   Total Protein 6.3 (L) 6.5 - 8.1 g/dL   Albumin 3.1 (L) 3.5 - 5.0 g/dL   AST 19 15 - 41 U/L   ALT 12 0 - 44 U/L   Alkaline Phosphatase 102 38 - 126 U/L   Total Bilirubin 0.6 0.3 - 1.2 mg/dL   GFR, Estimated >63 >87 mL/min   Anion gap 10 5 - 15  CBC with Differential   Collection Time: 02/07/21  4:11 PM  Result Value Ref Range   WBC 9.9 4.0 - 10.5 K/uL   RBC 3.82 (L) 3.87 - 5.11 MIL/uL   Hemoglobin 12.1 12.0 - 15.0 g/dL   HCT 56.4 (L) 33.2 - 95.1 %   MCV 93.7 80.0 - 100.0 fL   MCH 31.7 26.0 - 34.0 pg   MCHC 33.8 30.0 - 36.0 g/dL   RDW 88.4 16.6 - 06.3 %   Platelets 183 150 - 400 K/uL   nRBC 0.0 0.0 - 0.2 %   Neutrophils Relative % 74 %   Neutro Abs 7.4 1.7 - 7.7 K/uL   Lymphocytes Relative 17 %   Lymphs Abs 1.7 0.7 - 4.0 K/uL   Monocytes Relative 6 %   Monocytes Absolute 0.6 0.1 - 1.0 K/uL   Eosinophils Relative 1 %   Eosinophils Absolute 0.1  0.0 - 0.5 K/uL   Basophils Relative 0 %   Basophils Absolute 0.0 0.0 - 0.1 K/uL   Immature Granulocytes 2 %   Abs Immature Granulocytes 0.16 (H) 0.00 - 0.07 K/uL  Protein / creatinine ratio, urine   Collection Time: 02/07/21  4:16 PM  Result Value Ref Range   Creatinine, Urine 35 mg/dL   Total Protein, Urine 9 mg/dL   Protein Creatinine Ratio 0.26 (H) 0.00 - 0.15 mg/mg[Cre]    Treatments: none  Hospital Course:  This is a 30 y.o. G1P0 with IUP at [redacted]w[redacted]d seen for Pre-E evaluation.    1. Pre-E Labs WNL, PCR 260 02/07/21 1716 129/83  02/07/21 1701 128/81  02/07/21 1646 126/78  02/07/21 1631 127/77  02/07/21 1616 127/82  02/07/21 1601 127/78  +1/+2 LLE/RLE edema +1 reflexes, no clonus  2. Fetal Wellbeing NST:  FHR baseline: 130 bpm  Variability: moderate  Accelerations: yes  Decelerations: none  Category/reactivity: reactive  C/w Dr. Dalbert Garnet - Bolus and Tylenol   Headache lessened and visual spots  only once since she's been here. She was observed, fetal heart rate monitoring remained reassuring, and she had no signs/symptoms of pre-e with severe features or other maternal-fetal concerns.  She was deemed stable for discharge to home with outpatient follow up.  Discharge Physical Exam:  BP 134/84   Pulse 97   Temp 98 F (36.7 C) (Oral)   Resp 17   Ht 5\' 9"  (1.753 m)   Wt 98.4 kg   BMI 32.05 kg/m   General: NAD CV: RRR Pulm: nl effort ABD: s/nd/nt, gravid DVT Evaluation: LE non-ttp, no evidence of DVT on exam. TOCO: quiet SVE: deferred      Discharge Condition: Stable  Disposition:  Discharge disposition: 01-Home or Self Care        Allergies as of 02/07/2021   No Known Allergies     Medication List    STOP taking these medications   azithromycin 250 MG tablet Commonly known as: Zithromax Z-Pak   ondansetron 4 MG disintegrating tablet Commonly known as: ZOFRAN-ODT     TAKE these medications   famotidine 10 MG tablet Commonly known as:  PEPCID Take 10 mg by mouth 2 (two) times daily.   loratadine 10 MG tablet Commonly known as: CLARITIN Take 10 mg by mouth daily.   prenatal multivitamin Tabs tablet Take 1 tablet by mouth daily at 12 noon.        Signed5/25/2022, CNM 02/07/2021 7:28 PM

## 2021-02-07 NOTE — OB Triage Note (Signed)
Pt presents for increased BP at home. Pt reports HA for several days but hasnt taken any meds for it. Pt reports dizziness and seeing spots that started today. +1/+2 LLE/RLE edema. +1 reflexes no clonuc. VSS. Monitors applied. Will continue to monitor.

## 2021-02-15 LAB — OB RESULTS CONSOLE HIV ANTIBODY (ROUTINE TESTING): HIV: NONREACTIVE

## 2021-02-15 LAB — OB RESULTS CONSOLE GC/CHLAMYDIA
Chlamydia: NEGATIVE
Gonorrhea: NEGATIVE

## 2021-02-15 LAB — OB RESULTS CONSOLE GBS: GBS: NEGATIVE

## 2021-02-15 LAB — OB RESULTS CONSOLE RPR: RPR: NONREACTIVE

## 2021-02-18 ENCOUNTER — Other Ambulatory Visit: Payer: Self-pay

## 2021-02-18 ENCOUNTER — Other Ambulatory Visit
Admission: RE | Admit: 2021-02-18 | Discharge: 2021-02-18 | Disposition: A | Discharge status: Home / Self Care | Payer: BC Managed Care – PPO | Source: Ambulatory Visit | Attending: Obstetrics and Gynecology | Admitting: Obstetrics and Gynecology

## 2021-02-18 DIAGNOSIS — Z20822 Contact with and (suspected) exposure to covid-19: Secondary | ICD-10-CM | POA: Insufficient documentation

## 2021-02-18 DIAGNOSIS — Z01812 Encounter for preprocedural laboratory examination: Secondary | ICD-10-CM | POA: Insufficient documentation

## 2021-02-18 LAB — SARS CORONAVIRUS 2 (TAT 6-24 HRS): SARS Coronavirus 2: NEGATIVE

## 2021-02-20 ENCOUNTER — Other Ambulatory Visit: Payer: Self-pay

## 2021-02-20 ENCOUNTER — Encounter: Payer: Self-pay | Admitting: Obstetrics and Gynecology

## 2021-02-20 ENCOUNTER — Inpatient Hospital Stay
Admission: EM | Admit: 2021-02-20 | Discharge: 2021-02-22 | DRG: 806 | Disposition: A | Discharge status: Home / Self Care | Payer: BC Managed Care – PPO | Attending: Obstetrics and Gynecology | Admitting: Obstetrics and Gynecology

## 2021-02-20 DIAGNOSIS — O26893 Other specified pregnancy related conditions, third trimester: Secondary | ICD-10-CM | POA: Diagnosis present

## 2021-02-20 DIAGNOSIS — O9081 Anemia of the puerperium: Secondary | ICD-10-CM | POA: Diagnosis not present

## 2021-02-20 DIAGNOSIS — Z3A37 37 weeks gestation of pregnancy: Secondary | ICD-10-CM | POA: Diagnosis not present

## 2021-02-20 DIAGNOSIS — Z20822 Contact with and (suspected) exposure to covid-19: Secondary | ICD-10-CM | POA: Diagnosis present

## 2021-02-20 DIAGNOSIS — Z3490 Encounter for supervision of normal pregnancy, unspecified, unspecified trimester: Secondary | ICD-10-CM | POA: Diagnosis present

## 2021-02-20 DIAGNOSIS — D62 Acute posthemorrhagic anemia: Secondary | ICD-10-CM | POA: Diagnosis not present

## 2021-02-20 DIAGNOSIS — O1404 Mild to moderate pre-eclampsia, complicating childbirth: Principal | ICD-10-CM | POA: Diagnosis present

## 2021-02-20 LAB — CBC
HCT: 31.4 % — ABNORMAL LOW (ref 36.0–46.0)
Hemoglobin: 10.7 g/dL — ABNORMAL LOW (ref 12.0–15.0)
MCH: 31.7 pg (ref 26.0–34.0)
MCHC: 34.1 g/dL (ref 30.0–36.0)
MCV: 92.9 fL (ref 80.0–100.0)
Platelets: 146 10*3/uL — ABNORMAL LOW (ref 150–400)
RBC: 3.38 MIL/uL — ABNORMAL LOW (ref 3.87–5.11)
RDW: 13.2 % (ref 11.5–15.5)
WBC: 11.3 10*3/uL — ABNORMAL HIGH (ref 4.0–10.5)
nRBC: 0 % (ref 0.0–0.2)

## 2021-02-20 LAB — COMPREHENSIVE METABOLIC PANEL
ALT: 15 U/L (ref 0–44)
AST: 24 U/L (ref 15–41)
Albumin: 2.9 g/dL — ABNORMAL LOW (ref 3.5–5.0)
Alkaline Phosphatase: 109 U/L (ref 38–126)
Anion gap: 10 (ref 5–15)
BUN: 10 mg/dL (ref 6–20)
CO2: 18 mmol/L — ABNORMAL LOW (ref 22–32)
Calcium: 9.3 mg/dL (ref 8.9–10.3)
Chloride: 108 mmol/L (ref 98–111)
Creatinine, Ser: 0.58 mg/dL (ref 0.44–1.00)
GFR, Estimated: >60 mL/min (ref >60)
Glucose, Bld: 128 mg/dL — ABNORMAL HIGH (ref 70–99)
Potassium: 3.5 mmol/L (ref 3.5–5.1)
Sodium: 136 mmol/L (ref 135–145)
Total Bilirubin: 0.6 mg/dL (ref 0.3–1.2)
Total Protein: 6 g/dL — ABNORMAL LOW (ref 6.5–8.1)

## 2021-02-20 LAB — ABO/RH: ABO/RH(D): A POS

## 2021-02-20 LAB — RPR: RPR Ser Ql: NONREACTIVE

## 2021-02-20 LAB — PROTEIN / CREATININE RATIO, URINE
Creatinine, Urine: 48 mg/dL
Total Protein, Urine: <6 mg/dL

## 2021-02-20 LAB — TYPE AND SCREEN
ABO/RH(D): A POS
Antibody Screen: NEGATIVE

## 2021-02-20 MED ORDER — LACTATED RINGERS IV SOLN: INTRAVENOUS | Status: DC

## 2021-02-20 MED ORDER — CALCIUM CARBONATE ANTACID 500 MG PO CHEW
CHEWABLE_TABLET | ORAL | Status: AC
  Filled 2021-02-20: qty 2

## 2021-02-20 MED ORDER — OXYCODONE-ACETAMINOPHEN 5-325 MG PO TABS: 1.0000 | ORAL_TABLET | ORAL | Status: DC | PRN

## 2021-02-20 MED ORDER — OXYTOCIN-SODIUM CHLORIDE 30-0.9 UT/500ML-% IV SOLN
1.0000 m[IU]/min | INTRAVENOUS | Status: DC
  Administered 2021-02-20: 1 m[IU]/min via INTRAVENOUS
  Administered 2021-02-21: 2 m[IU]/min via INTRAVENOUS
  Filled 2021-02-20 (×2): qty 500

## 2021-02-20 MED ORDER — SOD CITRATE-CITRIC ACID 500-334 MG/5ML PO SOLN: 30.0000 mL | ORAL | Status: DC | PRN

## 2021-02-20 MED ORDER — ONDANSETRON HCL 4 MG/2ML IJ SOLN
4.0000 mg | Freq: Four times a day (QID) | INTRAMUSCULAR | Status: DC | PRN
  Administered 2021-02-21: 4 mg via INTRAVENOUS
  Filled 2021-02-20: qty 2

## 2021-02-20 MED ORDER — CALCIUM CARBONATE ANTACID 500 MG PO CHEW
2.0000 | CHEWABLE_TABLET | Freq: Once | ORAL | Status: AC
  Administered 2021-02-20: 400 mg via ORAL

## 2021-02-20 MED ORDER — MISOPROSTOL 200 MCG PO TABS
ORAL_TABLET | ORAL | Status: AC
  Filled 2021-02-20: qty 4

## 2021-02-20 MED ORDER — OXYTOCIN BOLUS FROM INFUSION
333.0000 mL | Freq: Once | INTRAVENOUS | Status: AC
  Administered 2021-02-21: 333 mL via INTRAVENOUS

## 2021-02-20 MED ORDER — MISOPROSTOL 25 MCG QUARTER TABLET
25.0000 ug | ORAL_TABLET | ORAL | Status: DC
  Administered 2021-02-20: 25 ug via BUCCAL
  Filled 2021-02-20 (×4): qty 1

## 2021-02-20 MED ORDER — LACTATED RINGERS IV SOLN
500.0000 mL | INTRAVENOUS | Status: DC | PRN
Start: 2021-02-20 — End: 2021-02-21
  Administered 2021-02-20: 500 mL via INTRAVENOUS

## 2021-02-20 MED ORDER — OXYTOCIN-SODIUM CHLORIDE 30-0.9 UT/500ML-% IV SOLN: 2.5000 [IU]/h | INTRAVENOUS | Status: DC

## 2021-02-20 MED ORDER — ACETAMINOPHEN 325 MG PO TABS
650.0000 mg | ORAL_TABLET | ORAL | Status: DC | PRN
  Administered 2021-02-20: 650 mg via ORAL
  Filled 2021-02-20: qty 2

## 2021-02-20 MED ORDER — MISOPROSTOL 25 MCG QUARTER TABLET
25.0000 ug | ORAL_TABLET | ORAL | Status: DC | PRN
  Administered 2021-02-20: 25 ug via VAGINAL
  Filled 2021-02-20 (×2): qty 1

## 2021-02-20 MED ORDER — OXYTOCIN 10 UNIT/ML IJ SOLN
INTRAMUSCULAR | Status: AC
  Filled 2021-02-20: qty 2

## 2021-02-20 MED ORDER — TERBUTALINE SULFATE 1 MG/ML IJ SOLN: 0.2500 mg | Freq: Once | INTRAMUSCULAR | Status: DC | PRN

## 2021-02-20 MED ORDER — OXYCODONE-ACETAMINOPHEN 5-325 MG PO TABS: 2.0000 | ORAL_TABLET | ORAL | Status: DC | PRN

## 2021-02-20 MED ORDER — LIDOCAINE HCL (PF) 1 % IJ SOLN
30.0000 mL | INTRAMUSCULAR | Status: DC | PRN
  Filled 2021-02-20: qty 30

## 2021-02-20 NOTE — H&P (Signed)
OB History & Physical   History of Present Illness:  Chief Complaint:   HPI:  Joanna Adams is a 30 y.o. G1P0 female at [redacted]w[redacted]d dated by LMP of 06/06/20.  She presents to L&D for IOL for Pre-e without severe features  She reports:  -active fetal movement -no leakage of fluid -no vaginal bleeding -no contractions  Pregnancy Issues: 1. Preeclampsia without severe features  2. Hx of abnormal pap smears  3. Varicella and rubella non-immune   Maternal Medical History:  No past medical history on file.  Past Surgical History:  Procedure Laterality Date  . LEEP    . NO PAST SURGERIES      No Known Allergies  Prior to Admission medications   Medication Sig Start Date End Date Taking? Authorizing Provider  famotidine (PEPCID) 10 MG tablet Take 10 mg by mouth 2 (two) times daily.    [provider]  loratadine (CLARITIN) 10 MG tablet Take 10 mg by mouth daily.    [provider]  Prenatal Vit-Fe Fumarate-FA (PRENATAL MULTIVITAMIN) TABS tablet Take 1 tablet by mouth daily at 12 noon.    [provider]     Prenatal care site: [facility]  Social History: She  reports that she has never smoked. She has never used smokeless tobacco. She reports previous alcohol use. She reports that she does not use drugs.  Family History: family history is not on file.   Review of Systems: A full review of systems was performed and negative except as noted in the HPI.    Physical Exam:  Vital Signs: There were no vitals taken for this visit.  General:   alert and cooperative  Skin:  normal  Neurologic:    Alert & oriented x 3  Lungs:   nl effort  Heart:   regular rate and rhythm  Abdomen:  soft, non-tender; bowel sounds normal; no masses,  no organomegaly  Extremities: : non-tender, symmetric, no edema bilaterally.      EFW: 02/18/21 3485 g 83%   Pertinent Results:  Prenatal Labs: Blood type/Rh A pos  Antibody screen neg  Rubella Immune   Varicella Non-Immune  RPR NR  HBsAg Neg  HIV NR  GC neg  Chlamydia neg  Genetic screening negative  1 hour GTT 131  3 hour GTT   GBS Neg   FHT: FHR: 130 bpm, variability: moderate,  accelerations:  Present,  decelerations:  Absent Category/reactivity:  Category I TOCO: none SVE: 1/70/-2    Assessment:  Joanna Adams is a 30 y.o. G1P0 female at [redacted]w[redacted]d with IOL for pre-e without severe features.   Plan:  1. Admit to Labor & Delivery; consents reviewed and obtained  2. Fetal Well being  - Fetal Tracing: Cat I - GBS neg - Presentation: VTX confirmed by SVE   3. Routine OB: - Prenatal labs reviewed, as above - Rh pos - CBC & T&S on admit - Clear fluids, IVF  4. Induction of Labor -  Contractions by external toco in place -  Plan for induction with Cytotec, Pitocin, Foley bulb -  Plan for continuous fetal monitoring  -  Maternal pain control as desired: IVPM, nitrous, regional anesthesia - Anticipate vaginal delivery  5. Post Partum Planning: - Infant feeding: breastfeeding - Contraception: POPs - Tdap 12/16/20 - Flu 08/2020  Joanna Adams, CNM 02/20/2021 12:22 AM

## 2021-02-20 NOTE — Progress Notes (Signed)
Labor Progress Note  Joanna Adams is a 30 y.o. G1P0 at [redacted]w[redacted]d by LMP admitted for induction of labor due to Pre-eclampsia without severe.  Subjective: Pt is comfortable   Objective: BP 123/83   Pulse 80   Temp 98.4 F (36.9 C) (Oral)   Resp 16   Ht 5\' 9"  (1.753 m)   Wt 100.2 kg   BMI 32.64 kg/m   Fetal Assessment: FHT:  FHR: 135 bpm, variability: moderate,  accelerations:  Present,  decelerations:  Absent Category/reactivity:  Category I UC:   none SVE:    Dilation: 1cm  Effacement: 90%  Station:  -2  Consistency: medium  Position: middle  Membrane status:Intact Amniotic color: n/a  Labs: Lab Results  Component Value Date   WBC 11.3 (H) 02/20/2021   HGB 10.7 (L) 02/20/2021   HCT 31.4 (L) 02/20/2021   MCV 92.9 02/20/2021   PLT 146 (L) 02/20/2021    Assessment / Plan: Induction of labor due to preeclampsia,  Pitocin started  Labor: Progress on Pitocin, will continue to increase then AROM Preeclampsia:  labs stable and 113/62 Fetal Wellbeing:  Category I Pain Control:  Labor support without medications I/D:  Afebrile, GBS neg, Intact Anticipated MOD:  NSVD  04/22/2021, CNM 02/20/2021, 5:38 PM

## 2021-02-20 NOTE — Progress Notes (Signed)
CNM in department and reviewed strip. Verbal order to start oxytocin. Report given to Remus Blake, RN.

## 2021-02-20 NOTE — Progress Notes (Signed)
Labor Progress Note  Joanna Adams is a 30 y.o. G1P0 at [redacted]w[redacted]d by LMP admitted for induction of labor due to Pre-eclampsia without severe.  Subjective: Pt is comfortable and on yoga ball  Objective: BP 121/78   Pulse 78   Temp 98.4 F (36.9 C) (Oral)   Resp 14   Ht 5\' 9"  (1.753 m)   Wt 100.2 kg   BMI 32.64 kg/m   Fetal Assessment: FHT:  FHR: 140 bpm, variability: moderate,  accelerations:  Present,  decelerations:  Absent Category/reactivity:  Category I UC:   Regular 1-3 min SVE:    Dilation: 1cm  Effacement: 100%  Station:  -2  Consistency: soft  Position: anterior  Membrane status: AROM'd at 1955 Amniotic color: clear  Labs: Lab Results  Component Value Date   WBC 11.3 (H) 02/20/2021   HGB 10.7 (L) 02/20/2021   HCT 31.4 (L) 02/20/2021   MCV 92.9 02/20/2021   PLT 146 (L) 02/20/2021    Assessment / Plan: Induction of labor due to preeclampsia,  Pitocin off at 1835 and restarted at 1954. Tums given 1839. AROM'd now with clear fluid - tolerated well.  Labor: Pitocin restarted  Preeclampsia:  labs stable and 121/78 Fetal Wellbeing:  Category I Pain Control:  Labor support without medications I/D:  Afebrile, GBS neg, AROM'd x 0 hrs Anticipated MOD:  NSVD  04/22/2021, CNM 02/20/2021, 7:57 PM

## 2021-02-20 NOTE — Progress Notes (Signed)
Labor Progress Note  Joanna Adams is a 30 y.o. G1P0 at [redacted]w[redacted]d by LMP admitted for induction of labor due to Pre-eclampsia without severe.  Subjective: Pt is comfortable and on yoga ball  Objective: BP 123/83   Pulse 80   Temp 98.4 F (36.9 C) (Oral)   Resp 16   Ht 5\' 9"  (1.753 m)   Wt 100.2 kg   BMI 32.64 kg/m   Fetal Assessment: FHT:  FHR: 130 bpm, variability: moderate,  accelerations:  Present,  decelerations:  Absent Category/reactivity:  Category I UC:   none SVE:    Dilation: 1cm  Effacement: 100%  Station:  -2  Consistency: soft  Position: anterior  Membrane status:Intact Amniotic color: n/a  Labs: Lab Results  Component Value Date   WBC 11.3 (H) 02/20/2021   HGB 10.7 (L) 02/20/2021   HCT 31.4 (L) 02/20/2021   MCV 92.9 02/20/2021   PLT 146 (L) 02/20/2021    Assessment / Plan: Induction of labor due to preeclampsia,  Pitocin at 3mU, foley bulb placed - well tolerated  Labor: Pitocin to stay at 61mU with foley bulb, will consider AROM after bulb  Preeclampsia:  labs stable and 123/83 Fetal Wellbeing:  Category I Pain Control:  Labor support without medications I/D:  Afebrile, GBS neg, Intact Anticipated MOD:  NSVD  30m, CNM 02/20/2021, 6:26 PM

## 2021-02-21 ENCOUNTER — Encounter: Payer: Self-pay | Admitting: Obstetrics and Gynecology

## 2021-02-21 ENCOUNTER — Inpatient Hospital Stay: Payer: BC Managed Care – PPO | Admitting: Anesthesiology

## 2021-02-21 MED ORDER — WITCH HAZEL-GLYCERIN EX PADS
1.0000 "application " | MEDICATED_PAD | CUTANEOUS | Status: DC | PRN
  Administered 2021-02-21: 1 via TOPICAL
  Filled 2021-02-21: qty 100

## 2021-02-21 MED ORDER — ONDANSETRON HCL 4 MG/2ML IJ SOLN: 4.0000 mg | INTRAMUSCULAR | Status: DC | PRN

## 2021-02-21 MED ORDER — CALCIUM CARBONATE ANTACID 500 MG PO CHEW
CHEWABLE_TABLET | ORAL | Status: AC
  Administered 2021-02-21: 200 mg
  Filled 2021-02-21: qty 2

## 2021-02-21 MED ORDER — TETANUS-DIPHTH-ACELL PERTUSSIS 5-2.5-18.5 LF-MCG/0.5 IM SUSY: 0.5000 mL | PREFILLED_SYRINGE | Freq: Once | INTRAMUSCULAR | Status: DC

## 2021-02-21 MED ORDER — EPHEDRINE 5 MG/ML INJ
10.0000 mg | INTRAVENOUS | Status: DC | PRN
  Filled 2021-02-21: qty 2

## 2021-02-21 MED ORDER — DIBUCAINE (PERIANAL) 1 % EX OINT
1.0000 "application " | TOPICAL_OINTMENT | CUTANEOUS | Status: DC | PRN
  Administered 2021-02-21: 1 via RECTAL
  Filled 2021-02-21: qty 28

## 2021-02-21 MED ORDER — FENTANYL 2.5 MCG/ML W/ROPIVACAINE 0.15% IN NS 100 ML EPIDURAL (ARMC)
EPIDURAL | Status: AC
  Filled 2021-02-21: qty 100

## 2021-02-21 MED ORDER — FENTANYL 2.5 MCG/ML W/ROPIVACAINE 0.15% IN NS 100 ML EPIDURAL (ARMC)
12.0000 mL/h | EPIDURAL | Status: DC
  Administered 2021-02-21: 12 mL/h via EPIDURAL

## 2021-02-21 MED ORDER — ONDANSETRON HCL 4 MG PO TABS: 4.0000 mg | ORAL_TABLET | ORAL | Status: DC | PRN

## 2021-02-21 MED ORDER — DIPHENHYDRAMINE HCL 25 MG PO CAPS: 25.0000 mg | ORAL_CAPSULE | Freq: Four times a day (QID) | ORAL | Status: DC | PRN

## 2021-02-21 MED ORDER — PHENYLEPHRINE 40 MCG/ML (10ML) SYRINGE FOR IV PUSH (FOR BLOOD PRESSURE SUPPORT)
80.0000 ug | PREFILLED_SYRINGE | INTRAVENOUS | Status: DC | PRN
  Filled 2021-02-21: qty 10

## 2021-02-21 MED ORDER — FERROUS SULFATE 325 (65 FE) MG PO TABS: 325.0000 mg | ORAL_TABLET | Freq: Two times a day (BID) | ORAL | Status: DC

## 2021-02-21 MED ORDER — OXYCODONE HCL 5 MG PO TABS: 5.0000 mg | ORAL_TABLET | ORAL | Status: DC | PRN

## 2021-02-21 MED ORDER — BENZOCAINE-MENTHOL 20-0.5 % EX AERO
1.0000 "application " | INHALATION_SPRAY | CUTANEOUS | Status: DC | PRN
  Administered 2021-02-21: 1 via TOPICAL
  Filled 2021-02-21: qty 56

## 2021-02-21 MED ORDER — PRENATAL MULTIVITAMIN CH
1.0000 | ORAL_TABLET | Freq: Every day | ORAL | Status: DC
  Administered 2021-02-21: 1 via ORAL
  Filled 2021-02-21: qty 1

## 2021-02-21 MED ORDER — DOCUSATE SODIUM 100 MG PO CAPS: 100.0000 mg | ORAL_CAPSULE | Freq: Two times a day (BID) | ORAL | Status: DC

## 2021-02-21 MED ORDER — CALCIUM CARBONATE ANTACID 500 MG PO CHEW: 200.0000 mg | CHEWABLE_TABLET | Freq: Once | ORAL | Status: DC

## 2021-02-21 MED ORDER — OXYCODONE HCL 5 MG PO TABS: 10.0000 mg | ORAL_TABLET | ORAL | Status: DC | PRN

## 2021-02-21 MED ORDER — SIMETHICONE 80 MG PO CHEW: 80.0000 mg | CHEWABLE_TABLET | ORAL | Status: DC | PRN

## 2021-02-21 MED ORDER — METHYLERGONOVINE MALEATE 0.2 MG PO TABS: 0.2000 mg | ORAL_TABLET | ORAL | Status: DC | PRN

## 2021-02-21 MED ORDER — BUPIVACAINE HCL (PF) 0.25 % IJ SOLN
INTRAMUSCULAR | Status: DC | PRN
  Administered 2021-02-21: 3 mL via EPIDURAL
  Administered 2021-02-21: 5 mL via EPIDURAL

## 2021-02-21 MED ORDER — ACETAMINOPHEN 325 MG PO TABS
650.0000 mg | ORAL_TABLET | ORAL | Status: DC | PRN
  Administered 2021-02-21: 650 mg via ORAL
  Filled 2021-02-21: qty 2

## 2021-02-21 MED ORDER — LIDOCAINE HCL (PF) 1 % IJ SOLN
INTRAMUSCULAR | Status: DC | PRN
  Administered 2021-02-21: 4 mL via SUBCUTANEOUS

## 2021-02-21 MED ORDER — LACTATED RINGERS IV SOLN
500.0000 mL | Freq: Once | INTRAVENOUS | Status: AC
  Administered 2021-02-21: 500 mL via INTRAVENOUS

## 2021-02-21 MED ORDER — DIPHENHYDRAMINE HCL 50 MG/ML IJ SOLN: 12.5000 mg | INTRAMUSCULAR | Status: DC | PRN

## 2021-02-21 MED ORDER — IBUPROFEN 600 MG PO TABS
600.0000 mg | ORAL_TABLET | Freq: Four times a day (QID) | ORAL | Status: DC
  Administered 2021-02-21 – 2021-02-22 (×3): 600 mg via ORAL
  Filled 2021-02-21 (×3): qty 1

## 2021-02-21 MED ORDER — METHYLERGONOVINE MALEATE 0.2 MG/ML IJ SOLN
0.2000 mg | INTRAMUSCULAR | Status: DC | PRN
Start: 2021-02-21 — End: 2021-02-22

## 2021-02-21 MED ORDER — COCONUT OIL OIL: 1.0000 "application " | TOPICAL_OIL | Status: DC | PRN

## 2021-02-21 MED ORDER — LIDOCAINE-EPINEPHRINE (PF) 1.5 %-1:200000 IJ SOLN
INTRAMUSCULAR | Status: DC | PRN
  Administered 2021-02-21: 4 mL via EPIDURAL

## 2021-02-21 NOTE — Lactation Note (Addendum)
This note was copied from a baby's chart. Lactation Consultation Note  Patient Name: Girl Belia Febo Today's Date: 02/21/2021 Reason for consult: Follow-up assessment;Mother's request;Difficult latch;Primapara;Early term 37-38.6wks;Other (Comment) (Gagging & spitty - not interested in sucking continuously) Age:30 years  Maternal Data Has patient been taught Hand Expression?: Yes Does the patient have breastfeeding experience prior to this delivery?: No (Gr 1, First baby)  Feeding Mother's Current Feeding Choice: Breast Milk  LC has worked with mom and baby for almost every breast feed.  Willow breast fed well in beginning, but most of today has been on and off the breast, gaggy, spitty and sleepy.   Mom's nipples are slightly flat, especially on the left, but will evert with compression.  Mom can hand express colostrum and have dripped some colostrum in her mouth when she is at the breast.  Was going to spoon feed for 7 pm breast feed since she had been on and off the breast for feedings, but she started gagging and spit mucous and then refused to re latch or open mouth.  She has a tendency to suck in her lower lip.  Demonstrated how to sandwich breast and gently touch chin to get lips more flanged and for deeper latch.  She has not been sustaining the latch for very long.  Mom brought her own Lansinoh nipple shield which we tried, but she did not sustain the latch much longer with the nipple shield than she did without it.  Mom denies any breast or nipple pain.  Mom has a medela DEBP at home she has received through her [employer].  At just 30 years of age, she has had 6 meconium stools and 2 voids.  Even though mom struggles with latching and maintaining the latch, she is committed to breast feeding and has supportive husband.  She has been putting Willow to the breast any time she demonstrates feeding cues.  As new parents, lots of questions have been addressed.  Hand out given on what to  expect with breast feeding the first 4 days of life discussing normal newborn stomach size, adequate intake and out put, burping, supply and demand, normal course of lactation, positioning, how to know she is getting enough and routine newborn feeding patterns.  Lactation [employer] given and reviewed support groups, [employer] breast feeding support, informational web sites and contact numbers.  Lactation name and number has been written on white board and encouraged to call with any questions, concerns or assistance.       LATCH Score Latch: Repeated attempts needed to sustain latch, nipple held in mouth throughout feeding, stimulation needed to elicit sucking reflex.  Audible Swallowing: A few with stimulation (Mostly from dripping in mouth)  Type of Nipple: Flat (Flattish at rest, especially right, but everts with compression)  Comfort (Breast/Nipple): Soft / non-tender  Hold (Positioning): Assistance needed to correctly position infant at breast and maintain latch.  LATCH Score: 6   Lactation Tools Discussed/Used Tools: Nipple Dorris Carnes;Other (comment) Nipple shield size: Other (comment) (Mom brought own Lansinoh nipple shield which we tried)  Interventions Interventions: Breast feeding basics reviewed;Assisted with latch;Skin to skin;Breast massage;Hand express;Reverse pressure;Breast compression;Adjust position;Support pillows;Position options  Discharge Discharge Education: Warning signs for feeding baby;Other (comment) Pump: Personal (Medela DEBP through [employer]) WIC Program: No [employer])  Consult Status Consult Status: Follow-up Follow-up type: Call as needed    Louis Meckel 02/21/2021, 10:47 PM

## 2021-02-21 NOTE — Anesthesia Procedure Notes (Signed)
Epidural Patient location during procedure: OB  Staffing Performed: anesthesiologist   Preanesthetic Checklist Completed: patient identified, IV checked, site marked, risks and benefits discussed, surgical consent, monitors and equipment checked, pre-op evaluation and timeout performed  Epidural Patient position: sitting Prep: Betadine Patient monitoring: heart rate, continuous pulse ox and blood pressure Approach: midline Location: L4-L5 Injection technique: LOR saline  Needle:  Needle type: Tuohy  Needle gauge: 18 G Needle length: 9 cm and 9 Needle insertion depth: 6 cm Catheter type: closed end flexible Catheter size: 19 Gauge Catheter at skin depth: 12 cm Test dose: negative and 1.5% lidocaine with Epi 1:200 K  Assessment Sensory level: T10 Events: blood not aspirated, injection not painful, no injection resistance, no paresthesia and negative IV test  Additional Notes   Patient tolerated the insertion well without complications.-SATD -IVTD. No paresthesia. Refer to [facility] nursing for VS and dosingReason for block:procedure for pain

## 2021-02-21 NOTE — Anesthesia Preprocedure Evaluation (Signed)
Anesthesia Evaluation  Patient identified by MRN, date of birth, ID band Patient awake    Reviewed: Allergy & Precautions, H&P , NPO status , Patient's Chart, lab work & pertinent test results, reviewed documented beta blocker date and time   Airway Mallampati: II  TM Distance: >3 FB Neck ROM: full    Dental no notable dental hx. (+) Teeth Intact   Pulmonary neg pulmonary ROS, Current Smoker,    Pulmonary exam normal breath sounds clear to auscultation       Cardiovascular Exercise Tolerance: Good hypertension,  Rhythm:regular Rate:Normal     Neuro/Psych negative neurological ROS  negative psych ROS   GI/Hepatic negative GI ROS, Neg liver ROS,   Endo/Other  negative endocrine ROSdiabetes, Well Controlled, Gestational  Renal/GU      Musculoskeletal   Abdominal   Peds  Hematology negative hematology ROS (+)   Anesthesia Other Findings   Reproductive/Obstetrics (+) Pregnancy                             Anesthesia Physical Anesthesia Plan  ASA: II  Anesthesia Plan: Epidural   Post-op Pain Management:    Induction:   PONV Risk Score and Plan:   Airway Management Planned:   Additional Equipment:   Intra-op Plan:   Post-operative Plan:   Informed Consent: I have reviewed the patients History and Physical, chart, labs and discussed the procedure including the risks, benefits and alternatives for the proposed anesthesia with the patient or authorized representative who has indicated his/her understanding and acceptance.       Plan Discussed with:   Anesthesia Plan Comments:         Anesthesia Quick Evaluation

## 2021-02-21 NOTE — Progress Notes (Signed)
Posterior shoulder dystocia, pt. Placed in mcroberts.

## 2021-02-21 NOTE — Discharge Summary (Signed)
Obstetrical Discharge Summary  Patient Name: Joanna Adams DOB: [birth year] MRN: [id number]  Date of Admission: 02/20/2021 Date of Delivery: 02/21/21 Delivered by: Anselm Pancoast Date of Discharge: 02/22/2021  Primary OB: [facility]  LMP:No LMP recorded. EDC Estimated Date of Delivery: 03/13/21 Gestational Age at Delivery: [redacted]w[redacted]d   Antepartum complications:  1. Preeclampsia without severe features  2. Hx of abnormal pap smears  3. Varicella and rubella non-immune Admitting Diagnosis: IOL for Pre-e without severe features Secondary Diagnosis: Patient Active Problem List   Diagnosis Date Noted  . NSVD (normal spontaneous vaginal delivery) 02/21/2021  . Gestational hypertension 02/07/2021    Induction: AROM, Pitocin, Cytotec and IP Foley Complications: None  Intrapartum complications/course:  Delivery Type: spontaneous vaginal delivery Anesthesia: epidural Placenta: spontaneous Laceration: 2nd degree vaginal, periuretrhal  Episiotomy: none Newborn Data: Live born female  "Willow" Birth Weight:  8lbs 7.8 oz, 3850g APGAR: 7, 9  Newborn Delivery   Time head delivered: 02/21/2021 06:43:00 Birth date/time: 02/20/2021 06:45:00 Delivery type: Vaginal, Spontaneous      30 y.o. G1P1001 at [redacted]w[redacted]d presenting for IOL for pre-e without severe features, AROM with clear fluid.  She progressed to complete and pushed over an intact perineum and delivered the fetal head, complicated by short posterior shoulder dystocia. She was in control the whole time, and the baby placed on the maternal abdomen. Cord clamped immediately and cut by the FOB, while the baby was skin to skin. The placenta delivered spontaneously and intact. 2nd degree vaginal and perineal lac as well as a small periurethral lac, both repaired in the standard fashion. Mom and baby tolerated the procedure well.   Postpartum Procedures: none  Post partum course:  Patient had an uncomplicated postpartum course.  By time of  discharge on PPD#1, her pain was controlled on oral pain medications; she had appropriate lochia and was ambulating, voiding without difficulty and tolerating regular diet.  Blood pressures were all normal without the use of oral antihypertensives.  She was instructed to notifiey CNM via MyChart on Friday following discharge with blood pressures and plan 1 week blood pressure visit in office. She was deemed stable for discharge to home.    Discharge Physical Exam:  BP 112/72 (BP Location: Left Arm)   Pulse 72   Temp 98.8 F (37.1 C) (Oral)   Resp 20   Ht 5\' 9"  (1.753 m)   Wt 100.2 kg   SpO2 98%   Breastfeeding Unknown   BMI 32.64 kg/m   General: alert and no distress Pulm: normal respiratory effort Lochia: appropriate Abdomen: soft, NT Uterine Fundus: firm, below umbilicus Perineum: minimal edema, repair well approximated  Extremities: No evidence of DVT seen on physical exam. No lower extremity edema. Edinburgh Postnatal Depression Scale Screening Tool 02/21/2021  I have been able to laugh and see the funny side of things. 0  I have looked forward with enjoyment to things. 0  I have blamed myself unnecessarily when things went wrong. 1  I have been anxious or worried for no good reason. 2  I have felt scared or panicky for no good reason. 0  Things have been getting on top of me. 0  I have been so unhappy that I have had difficulty sleeping. 0  I have felt sad or miserable. 0  I have been so unhappy that I have been crying. 0  The thought of harming myself has occurred to me. 0  Edinburgh Postnatal Depression Scale Total 3  Labs: CBC Latest Ref Rng & Units 02/22/2021 02/20/2021 02/07/2021  WBC 4.0 - 10.5 K/uL 10.8(H) 11.3(H) 9.9  Hemoglobin 12.0 - 15.0 g/dL 1.9(J) 10.7(L) 12.1  Hematocrit 36.0 - 46.0 % 27.5(L) 31.4(L) 35.8(L)  Platelets 150 - 400 K/uL 132(L) 146(L) 183   A POS Performed at [facility], [address] [place], [place]  [address]  Hemoglobin  Date Value Ref Range Status  02/22/2021 9.5 (L) 12.0 - 15.0 g/dL Final   HCT  Date Value Ref Range Status  02/22/2021 27.5 (L) 36.0 - 46.0 % Final    Disposition: stable, discharge to home Baby Feeding: breastmilk Baby Disposition: home with mom  Contraception: POPs  Prenatal Labs:  Blood type/Rh A pos  Antibody screen neg  Rubella Immune  Varicella Non-Immune  RPR NR  HBsAg Neg  HIV NR  GC neg  Chlamydia neg  Genetic screening negative  1 hour GTT 131  3 hour GTT   GBS Neg   Rh Immune globulin given: n/a Rubella vaccine given: Immune Varicella vaccine given: non-immune Tdap vaccine given in AP or PP setting: 12/16/20 Flu vaccine given in AP or PP setting: 08/2020  Plan: Josephine Cables was discharged to home in good condition. Follow-up appointment with delivering provider in 6 weeks.  Discharge Instructions: Per After Visit Summary. Activity: Advance as tolerated. Pelvic rest for 6 weeks.   Diet: Regular Discharge Medications: Allergies as of 02/22/2021   No Known Allergies     Medication List    TAKE these medications   acetaminophen 325 MG tablet Commonly known as: Tylenol Take 2 tablets (650 mg total) by mouth every 4 (four) hours as needed (for pain scale < 4).   calcium carbonate 500 MG chewable tablet Commonly known as: TUMS - dosed in mg elemental calcium Chew 1 tablet by mouth daily.   famotidine 10 MG tablet Commonly known as: PEPCID Take 10 mg by mouth 2 (two) times daily.   ibuprofen 600 MG tablet Commonly known as: ADVIL Take 1 tablet (600 mg total) by mouth every 6 (six) hours as needed.   loratadine 10 MG tablet Commonly known as: CLARITIN Take 10 mg by mouth daily.   prenatal multivitamin Tabs tablet Take 1 tablet by mouth daily at 12 noon.      Outpatient follow up:   Follow-up Information    [facility]. Schedule an appointment as soon as possible for a visit in 1 week(s).   Why: blood  pressure check Contact information: [address] [facility] [place] [address] [phone]       Haroldine Laws, CNM. Schedule an appointment as soon as possible for a visit in 6 week(s).   Specialty: Certified Nurse Midwife Why: postpartum visit   Contact information: [address] [place] [address] [phone]               Signed:  Margaretmary Eddy, CNM Certified Nurse Midwife [facility]

## 2021-02-22 LAB — CBC
HCT: 27.5 % — ABNORMAL LOW (ref 36.0–46.0)
Hemoglobin: 9.5 g/dL — ABNORMAL LOW (ref 12.0–15.0)
MCH: 32.1 pg (ref 26.0–34.0)
MCHC: 34.5 g/dL (ref 30.0–36.0)
MCV: 92.9 fL (ref 80.0–100.0)
Platelets: 132 10*3/uL — ABNORMAL LOW (ref 150–400)
RBC: 2.96 MIL/uL — ABNORMAL LOW (ref 3.87–5.11)
RDW: 13.5 % (ref 11.5–15.5)
WBC: 10.8 10*3/uL — ABNORMAL HIGH (ref 4.0–10.5)
nRBC: 0 % (ref 0.0–0.2)

## 2021-02-22 MED ORDER — MEASLES, MUMPS & RUBELLA VAC IJ SOLR
0.5000 mL | INTRAMUSCULAR | Status: DC | PRN
  Filled 2021-02-22 (×2): qty 0.5

## 2021-02-22 MED ORDER — VARICELLA VIRUS VACCINE LIVE 1350 PFU/0.5ML IJ SUSR
0.5000 mL | INTRAMUSCULAR | Status: DC | PRN
  Filled 2021-02-22 (×3): qty 0.5

## 2021-02-22 MED ORDER — IBUPROFEN 600 MG PO TABS: 600.0000 mg | ORAL_TABLET | Freq: Four times a day (QID) | ORAL | 0 refills | Status: DC | PRN

## 2021-02-22 MED ORDER — ACETAMINOPHEN 325 MG PO TABS: 650.0000 mg | ORAL_TABLET | ORAL | Status: DC | PRN

## 2021-02-22 NOTE — Anesthesia Postprocedure Evaluation (Signed)
Anesthesia Post Note  Patient: Joanna Adams  Procedure(s) Performed: AN AD HOC LABOR EPIDURAL  Patient location during evaluation: Mother Baby Anesthesia Type: Epidural Level of consciousness: awake and alert Pain management: pain level controlled Vital Signs Assessment: post-procedure vital signs reviewed and stable Respiratory status: spontaneous breathing, nonlabored ventilation and respiratory function stable Cardiovascular status: stable Postop Assessment: no headache, no backache and epidural receding Anesthetic complications: no   No complications documented.   Last Vitals:  Vitals:   02/21/21 2315 02/22/21 0433  BP: 112/60 (!) 104/57  Pulse: 90 88  Resp: 18   Temp: 37 C 36.9 C  SpO2: 98% 97%    Last Pain:  Vitals:   02/22/21 0542  TempSrc:   PainSc: 2                  Lynden Oxford

## 2021-02-22 NOTE — Discharge Instructions (Signed)
Please send Margaretmary Eddy, CNM a MyChart message on Friday with your blood pressures.     Vaginal Delivery, Care After Refer to this sheet in the next few weeks. These discharge instructions provide you with information on caring for yourself after delivery. Your caregiver may also give you specific instructions. Your treatment has been planned according to the most current medical practices available, but problems sometimes occur. Call your caregiver if you have any problems or questions after you go home. HOME CARE INSTRUCTIONS 1. Take over-the-counter or prescription medicines only as directed by your caregiver or pharmacist. 2. Do not drink alcohol, especially if you are breastfeeding or taking medicine to relieve pain. 3. Do not smoke tobacco. 4. Continue to use good perineal care. Good perineal care includes: 1. Wiping your perineum from back to front 2. Keeping your perineum clean. 3. You can do sitz baths twice a day, to help keep this area clean 5. Do not use tampons, douche or have sex until your caregiver says it is okay. 6. Shower only and avoid sitting in submerged water, aside from sitz baths 7. Wear a well-fitting bra that provides breast support. 8. Eat healthy foods. 9. Drink enough fluids to keep your urine clear or pale yellow. 10. Eat high-fiber foods such as whole grain cereals and breads, brown rice, beans, and fresh fruits and vegetables every day. These foods may help prevent or relieve constipation. 11. Avoid constipation with high fiber foods or medications, such as miralax or metamucil 12. Follow your caregiver's recommendations regarding resumption of activities such as climbing stairs, driving, lifting, exercising, or traveling. 13. Talk to your caregiver about resuming sexual activities. Resumption of sexual activities is dependent upon your risk of infection, your rate of healing, and your comfort and desire to resume sexual activity. 14. Try to have someone help  you with your household activities and your newborn for at least a few days after you leave the hospital. 15. Rest as much as possible. Try to rest or take a nap when your newborn is sleeping. 16. Increase your activities gradually. 17. Keep all of your scheduled postpartum appointments. It is very important to keep your scheduled follow-up appointments. At these appointments, your caregiver will be checking to make sure that you are healing physically and emotionally. SEEK MEDICAL CARE IF:   You are passing large clots from your vagina. Save any clots to show your caregiver.  You have a foul smelling discharge from your vagina.  You have trouble urinating.  You are urinating frequently.  You have pain when you urinate.  You have a change in your bowel movements.  You have increasing redness, pain, or swelling near your vaginal incision (episiotomy) or vaginal tear.  You have pus draining from your episiotomy or vaginal tear.  Your episiotomy or vaginal tear is separating.  You have painful, hard, or reddened breasts.  You have a severe headache.  You have blurred vision or see spots.  You feel sad or depressed.  You have thoughts of hurting yourself or your newborn.  You have questions about your care, the care of your newborn, or medicines.  You are dizzy or light-headed.  You have a rash.  You have nausea or vomiting.  You were breastfeeding and have not had a menstrual period within 12 weeks after you stopped breastfeeding.  You are not breastfeeding and have not had a menstrual period by the 12th week after delivery.  You have a fever. SEEK IMMEDIATE MEDICAL CARE IF:  You have persistent pain.  You have chest pain.  You have shortness of breath.  You faint.  You have leg pain.  You have stomach pain.  Your vaginal bleeding saturates two or more sanitary pads in 1 hour. MAKE SURE YOU:   Understand these instructions.  Will watch your  condition.  Will get help right away if you are not doing well or get worse. Document Released: 09/01/2000 Document Revised: 01/19/2014 Document Reviewed: 05/01/2012 [facility] Patient Information 2015 [place], [place]. This information is not intended to replace advice given to you by your health care provider. Make sure you discuss any questions you have with your health care provider.  Sitz Bath A sitz bath is a warm water bath taken in the sitting position. The water covers only the hips and butt (buttocks). We recommend using one that fits in the toilet, to help with ease of use and cleanliness. It may be used for either healing or cleaning purposes. Sitz baths are also used to relieve pain, itching, or muscle tightening (spasms). The water may contain medicine. Moist heat will help you heal and relax.  HOME CARE  Take 3 to 4 sitz baths a day. 18. Fill the bathtub half-full with warm water. 19. Sit in the water and open the drain a little. 20. Turn on the warm water to keep the tub half-full. Keep the water running constantly. 21. Soak in the water for 15 to 20 minutes. 22. After the sitz bath, pat the affected area dry. GET HELP RIGHT AWAY IF: You get worse instead of better. Stop the sitz baths if you get worse. MAKE SURE YOU:  Understand these instructions.  Will watch your condition.  Will get help right away if you are not doing well or get worse. Document Released: 10/12/2004 Document Revised: 05/29/2012 Document Reviewed: 01/02/2011 [facility] Patient Information 2015 [place], [place]. This information is not intended to replace advice given to you by your health care provider. Make sure you discuss any questions you have with your health care provider.

## 2021-02-22 NOTE — Lactation Note (Signed)
This note was copied from a baby's chart. Lactation Consultation Note  Patient Name: Joanna Adams Today's Date: 02/22/2021 Reason for consult: Follow-up assessment Age:29 hours Lactation Rounds: LC to the room for a visit. Mother is holding baby in her arms. Mother states feeds are going well with the bottle and formula and she is ok with that. LC reviewed and encouraged feeding on demand and with cues if Mother is still wanting to BF and then to pump for supply. If baby is not cueing we encourage hand expression and spoon feed to wake baby. Reviewed diaper counts for days of life and when to call Peds with questions. Reviewed "understanding Postpartum and Newborn care " booklet at bedside. Reviewed outpatient Lactation number and resources. Discussed baby's gestational age and how she may need more time to get on track with BF. Encouraged pumping 8x's/24 hours for 15-20 minutes. Reviewed outpatient follow up #. Mother has no further questions at this time.     Feeding Mother's Current Feeding Choice: Breast Milk and Formula   Lactation Tools Discussed/Used Tools: Pump Breast pump type: Double-Electric Breast Pump Pumped volume:  (drops)  Interventions Interventions: Breast feeding basics reviewed;Hand express;DEBP;Hand pump;Education  Discharge Discharge Education: Engorgement and breast care;Warning signs for feeding baby;Outpatient recommendation  Consult Status Consult Status: PRN Follow-up type: Call as needed    Brunette Lavalle D Brooklen Runquist 02/22/2021, 10:43 AM

## 2021-02-22 NOTE — Progress Notes (Signed)
Postpartum Day  1  Subjective: no complaints, up ad lib, voiding, tolerating PO and + flatus  Doing well, no concerns. Ambulating without difficulty, pain managed with PO meds, tolerating regular diet, and voiding without difficulty.   No fever/chills, chest pain, shortness of breath, nausea/vomiting, or leg pain. No nipple or breast pain. No headache, visual changes, or RUQ/epigastric pain.  Objective: BP 112/72 (BP Location: Left Arm)   Pulse 72   Temp 98.8 F (37.1 C) (Oral)   Resp 20   Ht 5' 9" (1.753 m)   Wt 100.2 kg   SpO2 98%   Breastfeeding Unknown   BMI 32.64 kg/m    Physical Exam:  General: alert, cooperative and no distress Breasts: soft/nontender CV: RRR Pulm: nl effort, CTABL Abdomen: soft, non-tender, active bowel sounds Uterine Fundus: firm Perineum: minimal edema, repair well approximated Lochia: appropriate DVT Evaluation: No evidence of DVT seen on physical exam.  Recent Labs    02/20/21 0036 02/22/21 0620  HGB 10.7* 9.5*  HCT 31.4* 27.5*  WBC 11.3* 10.8*  PLT 146* 132*    Assessment/Plan: 30 y.o. G1P1001 postpartum day # 1  -Continue routine postpartum care -Lactation consult PRN for breastfeeding  -Acute blood loss anemia - hemodynamically stable and asymptomatic; start PO ferrous sulfate BID with stool softeners  -Immunization status:   needs MMR and Varicella prior to discharge    Disposition: Desires discharge home today   LOS: 2 days   Minda Meo, CNM 02/22/2021, 8:46 AM   ----- Drinda Butts  Certified Nurse Midwife [facility]

## 2022-02-27 ENCOUNTER — Other Ambulatory Visit: Payer: Self-pay

## 2022-02-27 ENCOUNTER — Ambulatory Visit: Payer: BC Managed Care – PPO | Attending: Student

## 2022-02-27 DIAGNOSIS — R278 Other lack of coordination: Secondary | ICD-10-CM | POA: Diagnosis present

## 2022-02-27 DIAGNOSIS — R2689 Other abnormalities of gait and mobility: Secondary | ICD-10-CM

## 2022-02-27 DIAGNOSIS — N393 Stress incontinence (female) (male): Secondary | ICD-10-CM

## 2022-02-27 DIAGNOSIS — M6281 Muscle weakness (generalized): Secondary | ICD-10-CM

## 2022-02-27 NOTE — Therapy (Signed)
[facility] MAIN [facility] [address] [place], [place], [address] Phone: [phone]   Fax:  [phone]  Physical Therapy Evaluation  Patient Details  Name: Joanna Adams MRN: [id number] Date of Birth: [birth year] Referring Provider (PT): Haroldine LawsJennifer Oxley, [place]   Encounter Date: 02/27/2022   PT End of Session - 02/27/22 1353     Visit Number 1    Number of Visits 9    Date for PT Re-Evaluation 04/28/22    Authorization Type BCBS    Progress Note Due on Visit 10    PT Start Time 1300    PT Stop Time 1344    PT Time Calculation (min) 44 min    Activity Tolerance Patient tolerated treatment well    Behavior During Therapy [facility] for tasks assessed/performed             History reviewed. No pertinent past medical history.  Past Surgical History:  Procedure Laterality Date   LEEP  2016   NO PAST SURGERIES      There were no vitals filed for this visit.    Subjective Assessment - 02/27/22 1308     Subjective Pt stated she had her dtr on 02/21/21 and had a grade 2 tear. She has pain during initial penetration and has experienced bleeding and with deep penetration (especially during certain positions that allow deeper penetration). Pt is no longer breastfeeding. Core: no reports of core issues. Back: hx of lower back pain, which has gotten worse since having her child. Urinary: leaking with exercising (jumping jacks and heavy lifting) and sneezing/coughing but denied urge incontinence. Hx of UTIs. Pt denied bowel incontinence. Pt workouts M,W,F Bootcamp style workout and then lifting and running T/H. 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    Pertinent History pre-eclampsia during pregnancy    Patient Stated Goals To enjoy sex the entire time. Stop leakage.    Currently in Pain? No/denies                [facility] Assessment - 02/27/22 1316       Assessment   Medical Diagnosis Unspecified dyspareunia     Referring Provider (PT) Haroldine LawsJennifer Oxley, CNM    Onset Date/Surgical Date 08/16/21   referral date   Hand Dominance Right    Prior Therapy none      Precautions   Precautions None      Restrictions   Weight Bearing Restrictions No      Balance Screen   Has the patient fallen in the past 6 months No      Home Environment   Living Environment Private residence    Living Arrangements Spouse/significant other;Children    Available Help at Discharge Available PRN/intermittently    Type of Home House      Prior Function   Level of Independence Independent    Vocation Full time employment    Vocation Requirements standing at desk, 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 A little bit after postpartum  Unexplained weight loss? No  Saddle anesthesia? Occasional after sitting for too long postpartum Unexplained changes in bowel  or bladder changes? no     OBJECTIVE  Posture/Observations:  Sitting: LEs crossed at knees Standing: incr. Tx spine kyphosis, incr. Lx spine lordosis and ant. Pelvic tilt    Range of Motion/Flexibilty: not formally assessed 2/2 time constraints Spine: Hips:   Strength/MMT:  LE MMT  B knee ext: 5/5 B knee flex: 4+/5 B ankle DF: 5/5 LE MMT Left Right  Hip flex:  (L2) 5/5 5/5  Hip ext: /5 /5  Hip abd: 3/5 3/5  Hip add: 3+/5 3+/5  Hip IR /5 /5  Hip ER /5 /5     Abdominal:  Palpation: no TTP Diastasis: 1.5 fingers width at umbilicus, otherwise 1 finger width sup/inf to umbilicus  Pelvic Floor External Exam: Introitus Appears:  Skin integrity:  Palpation: Cough: Prolapse visible?: Scar mobility: Through clothing: yes Ischial tuberosities: Palpation for  pelvic floor contraction: yes but difficulty with lift Coccyx: no TTP           Objective measurements completed on examination: See above findings.     Pelvic Floor Special Questions - 02/27/22 1318     Are you Pregnant or attempting pregnancy? 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 1-2x/four hours    Fecal incontinence No    Fluid intake Celcius upon wake up, 32 oz of water working out, a cup of coffee or two, collagen drink with water, 2-3 cups of 40 oz. of water, diet Coke at lunch. Maybe a glass of wine 1-2/month    Caffeine beverages Diet Coke    Falling out feeling (prolapse) No                     Self care:  PT Education - 02/27/22 1352     Education Details PT discussed POC, frequency and duration. PT educated pt on proper seated posture (not crossing LEs) and proper toileting posture to fully empty bladder. Pt using squatty potty during bowel movements.    Person(s) Educated Patient    Methods Demonstration;Verbal cues    Comprehension Need further instruction;Returned demonstration;Verbalized understanding              PT Short Term Goals - 02/27/22 1358       PT SHORT TERM GOAL #1   Title Pt will be IND in HEP to improve SUI, balance,  strength, and reduce pain.    Time 4    Period Weeks    Status New    Target Date 03/27/22      PT SHORT TERM GOAL #2   Title Perform internal muscle assesment and assess spine mobility and write  goals prn.    Time 4    Period Weeks    Status New    Target Date 03/27/22      PT SHORT TERM GOAL #3   Title Perform FOTO and write goals prn.    Time 4    Period Weeks    Status New    Target Date 03/27/22      PT SHORT TERM GOAL #4   Title Pt will demonstrate improved coordination of pelvic floor musculature with contraction and relaxation in order to reduce pain during intercourse.    Time 4    Period Weeks    Status New    Target Date 03/27/22               PT Long Term Goals - 02/27/22 1359       PT LONG TERM GOAL #1   Title Pt will demonstrate improved posture and alignment and PFM coordination over the last two weeks to decr. pain and improve SUI.    Time 8    Period Weeks    Status New    Target Date 04/24/22      PT LONG TERM GOAL #2   Title Pt will report ability to perform PFM contraction with exhalation during jumping jacks and lifting at gym without leakage.    Time 8    Period Weeks    Status New    Target Date 04/24/22      PT LONG TERM GOAL #3   Title Pt will demo and verbalize relaxation and stretching activities to decr. PFM tension during intercourse, along with scar mobilization to decr. pain.    Time 8    Period Weeks    Status New                    Plan - 02/27/22 1353     Clinical Impression Statement Pt is a pleasant 31y/o female presenting to OP pelvic health PT for dyspareunia. Pt's PMH is significant for the following: preeclampsia with pregnanacy and postpartum depression/anxiety-no longer taking medication. The following impairments were noted upon exam: impaired posture (incr. tx spine kyphosis and lx spine lordosis, ant. pelvic tilt), decr. strength (deep core and hips), impaired flexibility, impaired SLS balance,  impaired coordination of PFM and breath, gait deviations and pain with intercourse, along with SUI. Pt would benefit from skilled PT to improve deficits listed above during all ADLs.    Personal Factors and Comorbidities Time since onset of injury/illness/exacerbation    Examination-Activity Limitations Caring for Others;Continence;Squat;Transfers;Locomotion Level    Examination-Participation Restrictions Interpersonal Relationship;Occupation    Stability/Clinical Decision Making Stable/Uncomplicated    Clinical Decision Making Low    Rehab Potential Good    PT Frequency 1x / week    PT Duration 8 weeks    PT Treatment/Interventions ADLs/Self Care Home Management;Biofeedback;Moist Heat;Gait training;Functional mobility training;Therapeutic activities;Neuromuscular re-education;Balance training;Therapeutic exercise;Patient/family education;Manual techniques;Dry needling;Scar mobilization;Joint Manipulations;Spinal Manipulations    PT Next Visit Plan internal muscle assessment, strengthening, barre, hips/flexibility    Consulted and Agree with Plan of Care Patient             Patient will benefit from skilled therapeutic intervention in order to improve the following deficits and impairments:  Abnormal gait, Decreased balance, Hypomobility, Decreased coordination, Decreased strength, Impaired flexibility, Postural dysfunction (hypomobility likely based on posture)  Visit Diagnosis: Muscle weakness (generalized) - Plan: PT plan of care cert/re-cert  Stress incontinence (female) (female) - Plan: PT plan of care cert/re-cert  Other lack of coordination - Plan: PT plan of care cert/re-cert  Other abnormalities of gait and mobility - Plan: PT plan of care cert/re-cert     Problem List Patient Active Problem List   Diagnosis Date Noted   NSVD (normal spontaneous vaginal delivery) 02/21/2021   Gestational hypertension 02/07/2021    Ruslan Mccabe L, PT 02/27/2022, 2:02 PM  [facility] MAIN [facility] SERVICES [address] [place], [place], [address] Phone: [phone]   Fax:  [phone]  Name: Joanna Adams MRN: [id number] Date of Birth: [birth year]  Zerita Boers, PT,DPT 02/27/22 2:03 PM Phone: [phone] Fax: [phone]

## 2022-03-13 ENCOUNTER — Ambulatory Visit: Payer: BC Managed Care – PPO

## 2022-04-12 ENCOUNTER — Ambulatory Visit: Payer: BC Managed Care – PPO

## 2022-07-25 LAB — OB RESULTS CONSOLE HEPATITIS B SURFACE ANTIGEN: Hepatitis B Surface Ag: NEGATIVE

## 2022-07-25 LAB — OB RESULTS CONSOLE RUBELLA ANTIBODY, IGM: Rubella: NON-IMMUNE/NOT IMMUNE

## 2022-07-25 LAB — OB RESULTS CONSOLE VARICELLA ZOSTER ANTIBODY, IGG: Varicella: NON-IMMUNE/NOT IMMUNE

## 2022-09-18 NOTE — L&D Delivery Note (Signed)
Delivery Note  First Stage: Labor onset: 1830 Augmentation : pitocin Analgesia /Anesthesia intrapartum: epidural SROM at 1830  Second Stage: Complete dilation at 0235 Onset of pushing at 0244 FHR second stage unable to determine, imminent delivery, pushed  Delivery of a viable female infant 01/18/2023 at 0246 by Donato Schultz, CNM. delivery of fetal head in OA position with restitution to LOT. Loose nuchal cord;  Anterior then posterior shoulders delivered easily with gentle downward traction. Baby placed on mom's chest, and attended to by peds.  Cord double clamped after cessation of pulsation, cut by FOB  Third Stage: Placenta delivered Tomasa Blase intact with 3 VC @ 0257 Placenta disposition: discarded Uterine tone firm / bleeding scant  2nd degree laceration identified  Anesthesia for repair: epidural Repair 2-0 Vicryl Est. Blood Loss (mL):  Complications: none  Mom to postpartum.  Baby to Couplet care / Skin to Skin.  Newborn: Birth Weight: TBD, infant skin-to-skin  Apgar Scores: 8, 9 Feeding planned: breast and formula

## 2022-11-22 ENCOUNTER — Encounter: Payer: Self-pay | Admitting: Obstetrics and Gynecology

## 2022-11-22 ENCOUNTER — Other Ambulatory Visit: Payer: Self-pay

## 2022-11-22 ENCOUNTER — Observation Stay
Admission: EM | Admit: 2022-11-22 | Discharge: 2022-11-22 | Disposition: A | Discharge status: Home / Self Care | Payer: BC Managed Care – PPO | Attending: Obstetrics and Gynecology | Admitting: Obstetrics and Gynecology

## 2022-11-22 DIAGNOSIS — R109 Unspecified abdominal pain: Secondary | ICD-10-CM | POA: Diagnosis present

## 2022-11-22 DIAGNOSIS — O26893 Other specified pregnancy related conditions, third trimester: Secondary | ICD-10-CM | POA: Diagnosis present

## 2022-11-22 DIAGNOSIS — R1012 Left upper quadrant pain: Secondary | ICD-10-CM | POA: Diagnosis not present

## 2022-11-22 DIAGNOSIS — Z3A29 29 weeks gestation of pregnancy: Secondary | ICD-10-CM | POA: Diagnosis not present

## 2022-11-22 LAB — COMPREHENSIVE METABOLIC PANEL
ALT: 13 U/L (ref 0–44)
AST: 16 U/L (ref 15–41)
Albumin: 3.1 g/dL — ABNORMAL LOW (ref 3.5–5.0)
Alkaline Phosphatase: 71 U/L (ref 38–126)
Anion gap: 7 (ref 5–15)
BUN: 7 mg/dL (ref 6–20)
CO2: 21 mmol/L — ABNORMAL LOW (ref 22–32)
Calcium: 8.3 mg/dL — ABNORMAL LOW (ref 8.9–10.3)
Chloride: 109 mmol/L (ref 98–111)
Creatinine, Ser: 0.56 mg/dL (ref 0.44–1.00)
GFR, Estimated: >60 mL/min (ref >60)
Glucose, Bld: 90 mg/dL (ref 70–99)
Potassium: 3.9 mmol/L (ref 3.5–5.1)
Sodium: 137 mmol/L (ref 135–145)
Total Bilirubin: 0.6 mg/dL (ref 0.3–1.2)
Total Protein: 6.5 g/dL (ref 6.5–8.1)

## 2022-11-22 LAB — CBC
HCT: 32.6 % — ABNORMAL LOW (ref 36.0–46.0)
Hemoglobin: 10.8 g/dL — ABNORMAL LOW (ref 12.0–15.0)
MCH: 30.5 pg (ref 26.0–34.0)
MCHC: 33.1 g/dL (ref 30.0–36.0)
MCV: 92.1 fL (ref 80.0–100.0)
Platelets: 196 10*3/uL (ref 150–400)
RBC: 3.54 MIL/uL — ABNORMAL LOW (ref 3.87–5.11)
RDW: 14.1 % (ref 11.5–15.5)
WBC: 10.5 10*3/uL (ref 4.0–10.5)
nRBC: 0 % (ref 0.0–0.2)

## 2022-11-22 LAB — URINALYSIS, ROUTINE W REFLEX MICROSCOPIC
Bilirubin Urine: NEGATIVE
Glucose, UA: NEGATIVE mg/dL
Hgb urine dipstick: NEGATIVE
Ketones, ur: NEGATIVE mg/dL
Nitrite: NEGATIVE
Protein, ur: NEGATIVE mg/dL
Specific Gravity, Urine: 1.003 — ABNORMAL LOW (ref 1.005–1.030)
pH: 6 (ref 5.0–8.0)

## 2022-11-22 LAB — LIPASE, BLOOD: Lipase: 44 U/L (ref 11–51)

## 2022-11-22 LAB — AMYLASE: Amylase: 49 U/L (ref 28–100)

## 2022-11-22 MED ORDER — LACTATED RINGERS IV SOLN: 125.0000 mL/h | INTRAVENOUS | Status: DC

## 2022-11-22 MED ORDER — PANTOPRAZOLE SODIUM 40 MG PO TBEC
40.0000 mg | DELAYED_RELEASE_TABLET | Freq: Every day | ORAL | Status: DC
  Administered 2022-11-22: 40 mg via ORAL
  Filled 2022-11-22: qty 1

## 2022-11-22 MED ORDER — PANTOPRAZOLE SODIUM 40 MG PO TBEC: 40.0000 mg | DELAYED_RELEASE_TABLET | Freq: Every day | ORAL | 0 refills | Status: DC

## 2022-11-22 MED ORDER — CYCLOBENZAPRINE HCL 5 MG PO TABS: 5.0000 mg | ORAL_TABLET | Freq: Three times a day (TID) | ORAL | 0 refills | Status: DC | PRN

## 2022-11-22 MED ORDER — ACETAMINOPHEN 325 MG PO TABS: 650.0000 mg | ORAL_TABLET | ORAL | Status: DC | PRN

## 2022-11-22 NOTE — Discharge Summary (Signed)
Joanna Adams is a 32 y.o. female. She is at 31w6dgestation. Patient's last menstrual period was 04/27/2022. Estimated Date of Delivery: 02/01/23  [facility]  Current pregnancy complicated by:  Hx Pre-E without severe features 02/2021: taking baby ASA Iron deficiency anemia; Hgb 10.5, taking iron Failed 1hr GTT 137; passed 3hr GTT Rubella and Varicella NON-immune    Chief complaint: Left upper quadrant pain, started this morning around 10am, no recent vomiting, has had some intermittent coughing related to allergies. Active FM, denies VB, LOF or UCs. Reports it is a superficial pain and feels "bruised".   S: Resting comfortably. no CTX, no VB.no LOF,  Active fetal movement. Denies: HA, visual changes, SOB, or RUQ/epigastric pain  Maternal Medical History:  History reviewed. No pertinent past medical history.  Past Surgical History:  Procedure Laterality Date   LEEP  2016   NO PAST SURGERIES      No Known Allergies  Prior to Admission medications   Medication Sig Start Date End Date Taking? Authorizing Provider  Ascorbic Acid (VITAMIN C) 1000 MG tablet Take 1,000 mg by mouth daily.   Yes [provider]  famotidine (PEPCID) 10 MG tablet Take 10 mg by mouth 2 (two) times daily.   Yes [provider]  ferrous sulfate 325 (65 FE) MG EC tablet Take 325 mg by mouth 2 (two) times daily.   Yes [provider]  fluticasone (FLONASE) 50 MCG/ACT nasal spray Place 1 spray into both nostrils daily.   Yes [provider]  loratadine (CLARITIN) 10 MG tablet Take 10 mg by mouth daily.   Yes [provider]  nitrofurantoin, macrocrystal-monohydrate, (MACROBID) 100 MG capsule Take 100 mg by mouth 2 (two) times daily.   Yes [provider]  Prenatal Vit-Fe Fumarate-FA (PRENATAL MULTIVITAMIN) TABS tablet Take 1 tablet by mouth daily at 12 noon.   Yes [provider]  acetaminophen (TYLENOL) 325 MG  tablet Take 2 tablets (650 mg total) by mouth every 4 (four) hours as needed (for pain scale < 4). 02/22/21   MMinda Meo CNM  calcium carbonate (TUMS - DOSED IN MG ELEMENTAL CALCIUM) 500 MG chewable tablet Chew 1 tablet by mouth daily. Patient not taking: Reported on 11/22/2022    [provider]  ibuprofen (ADVIL) 600 MG tablet Take 1 tablet (600 mg total) by mouth every 6 (six) hours as needed. Patient not taking: Reported on 11/22/2022 02/22/21   MMinda Meo CNM  Norgestim-Eth Estrad Triphasic (TRI-SPRINTEC PO) Take by mouth.    [provider]      Social History: She  reports that she has never smoked. She has never used smokeless tobacco. She reports current alcohol use. She reports that she does not use drugs.  Family History: no history of gyn cancers  Review of Systems: A full review of systems was performed and negative except as noted in the HPI.     O:  BP (!) 93/57   Pulse 84   Temp 98.1 F (36.7 C) (Oral)   Resp 20   LMP 04/27/2022   Results for orders placed or performed during the hospital encounter of 11/22/22 (from the past 48 hour(s))  Urinalysis, Routine w reflex microscopic -Urine, Clean Catch   Collection Time: 11/22/22  1:43 PM  Result Value Ref Range   Color, Urine STRAW (A) YELLOW   APPearance CLEAR (A) CLEAR   Specific Gravity, Urine 1.003 (L) 1.005 - 1.030   pH  6.0 5.0 - 8.0   Glucose, UA NEGATIVE NEGATIVE mg/dL   Hgb urine dipstick NEGATIVE NEGATIVE   Bilirubin Urine NEGATIVE NEGATIVE   Ketones, ur NEGATIVE NEGATIVE mg/dL   Protein, ur NEGATIVE NEGATIVE mg/dL   Nitrite NEGATIVE NEGATIVE   Leukocytes,Ua SMALL (A) NEGATIVE   RBC / HPF 0-5 0 - 5 RBC/hpf   WBC, UA 0-5 0 - 5 WBC/hpf   Bacteria, UA RARE (A) NONE SEEN   Squamous Epithelial / HPF 0-5 0 - 5 /HPF  Comprehensive metabolic panel   Collection Time: 11/22/22  2:45 PM  Result Value Ref Range   Sodium 137 135 - 145 mmol/L   Potassium 3.9 3.5 - 5.1 mmol/L   Chloride 109  98 - 111 mmol/L   CO2 21 (L) 22 - 32 mmol/L   Glucose, Bld 90 70 - 99 mg/dL   BUN 7 6 - 20 mg/dL   Creatinine, Ser 0.56 0.44 - 1.00 mg/dL   Calcium 8.3 (L) 8.9 - 10.3 mg/dL   Total Protein 6.5 6.5 - 8.1 g/dL   Albumin 3.1 (L) 3.5 - 5.0 g/dL   AST 16 15 - 41 U/L   ALT 13 0 - 44 U/L   Alkaline Phosphatase 71 38 - 126 U/L   Total Bilirubin 0.6 0.3 - 1.2 mg/dL   GFR, Estimated >60 >60 mL/min   Anion gap 7 5 - 15  Lipase, blood   Collection Time: 11/22/22  2:45 PM  Result Value Ref Range   Lipase 44 11 - 51 U/L  Amylase   Collection Time: 11/22/22  2:45 PM  Result Value Ref Range   Amylase 49 28 - 100 U/L  CBC   Collection Time: 11/22/22  2:45 PM  Result Value Ref Range   WBC 10.5 4.0 - 10.5 K/uL   RBC 3.54 (L) 3.87 - 5.11 MIL/uL   Hemoglobin 10.8 (L) 12.0 - 15.0 g/dL   HCT 32.6 (L) 36.0 - 46.0 %   MCV 92.1 80.0 - 100.0 fL   MCH 30.5 26.0 - 34.0 pg   MCHC 33.1 30.0 - 36.0 g/dL   RDW 14.1 11.5 - 15.5 %   Platelets 196 150 - 400 K/uL   nRBC 0.0 0.0 - 0.2 %     Constitutional: NAD, AAOx3  HE/ENT: extraocular movements grossly intact, moist mucous membranes CV: RRR PULM: nl respiratory effort, CTABL     Abd: gravid, non-tender, non-distended, soft      Ext: Non-tender, Nonedematous   Psych: mood appropriate, speech normal Pelvic: deferred  Fetal  monitoring: Cat I Appropriate for GA Baseline: 135bpm Variability: moderate Accelerations: present x >2 Decelerations absent   A/P: 32 y.o. 27w6dhere for antenatal surveillance for musculoskeletal strain, LUQ  Principle Diagnosis:  left abdominal pain  Suspect musculoskeletal strain, moving furnitre yesterday. Discussed tylenol and trying  Labor: not present.  Fetal Wellbeing: Reassuring Cat 1 tracing; Reactive NST  D/c home stable, precautions reviewed, follow-up as scheduled.    RFrancetta Found CNM 11/22/2022  4:41 PM

## 2022-11-22 NOTE — Progress Notes (Signed)
Patient discharged home after discharge instructions and prescription given to and reviewed with Patient.  Patient verbalized understanding.

## 2022-11-22 NOTE — OB Triage Note (Signed)
Pt came in c/o upper right abdominal pain, nausea and fatigue.  Patient has history of UTI and finished their last antibiotic today.  Patient denies any flank pain, frequent or painful urination.  Monitors applied and UA sent.

## 2022-11-23 LAB — URINE CULTURE: Culture: NO GROWTH

## 2022-12-07 ENCOUNTER — Encounter: Payer: Self-pay | Admitting: Obstetrics and Gynecology

## 2022-12-07 ENCOUNTER — Observation Stay
Admission: EM | Admit: 2022-12-07 | Discharge: 2022-12-07 | Disposition: A | Discharge status: Home / Self Care | Payer: BC Managed Care – PPO | Attending: Obstetrics and Gynecology | Admitting: Obstetrics and Gynecology

## 2022-12-07 ENCOUNTER — Other Ambulatory Visit: Payer: Self-pay

## 2022-12-07 DIAGNOSIS — R102 Pelvic and perineal pain: Secondary | ICD-10-CM | POA: Insufficient documentation

## 2022-12-07 DIAGNOSIS — O4290 Premature rupture of membranes, unspecified as to length of time between rupture and onset of labor, unspecified weeks of gestation: Secondary | ICD-10-CM | POA: Diagnosis present

## 2022-12-07 DIAGNOSIS — O26893 Other specified pregnancy related conditions, third trimester: Secondary | ICD-10-CM | POA: Diagnosis not present

## 2022-12-07 DIAGNOSIS — Z3A32 32 weeks gestation of pregnancy: Secondary | ICD-10-CM | POA: Insufficient documentation

## 2022-12-07 DIAGNOSIS — O4193X0 Disorder of amniotic fluid and membranes, unspecified, third trimester, not applicable or unspecified: Principal | ICD-10-CM | POA: Insufficient documentation

## 2022-12-07 LAB — WET PREP, GENITAL
Clue Cells Wet Prep HPF POC: NONE SEEN
Sperm: NONE SEEN
Trich, Wet Prep: NONE SEEN
WBC, Wet Prep HPF POC: <10 (ref <10)
Yeast Wet Prep HPF POC: NONE SEEN

## 2022-12-07 LAB — RUPTURE OF MEMBRANE (ROM)PLUS: Rom Plus: NEGATIVE

## 2022-12-07 LAB — FETAL FIBRONECTIN: Fetal Fibronectin: NEGATIVE

## 2022-12-07 NOTE — OB Triage Note (Signed)
Pt discharged home per order.  Pt stable and ambulatory and an After Visit Summary was printed and given to the patient. Discharge education completed with patient/family including follow up instructions, appointments, and medication list. Pt received labor and bleeding precautions. Patient able to verbalize understanding, all questions fully answered upon discharge. Patient instructed to return to ED, call 911, or call MD for any changes in condition. Pt discharged home via personal vehicle with support person.  ° °

## 2022-12-07 NOTE — Discharge Summary (Signed)
Patient ID: Joanna Adams MRN: [id number] DOB/AGE: 31/09/1990 31 y.o.  Admit date: 12/07/2022 Discharge date: 12/07/2022  Admission Diagnoses: 32yo G2P1 at [redacted]w[redacted]d presents with complaints of LOF and pelvic pressure.  Denies UCs or VB.  Reports GFM  Discharge Diagnoses: Normal pregnancy at 31 weeks  Factors complicating pregnancy: Hx Pre-E without severe features 02/2021:  Anemia Rubella and Varicella NON-immune   Prenatal Procedures: NST  Consults: None  Significant Diagnostic Studies:  Results for orders placed or performed during the hospital encounter of 12/07/22 (from the past 168 hour(s))  Wet prep, genital   Collection Time: 12/07/22 10:56 AM  Result Value Ref Range   Yeast Wet Prep HPF POC NONE SEEN NONE SEEN   Trich, Wet Prep NONE SEEN NONE SEEN   Clue Cells Wet Prep HPF POC NONE SEEN NONE SEEN   WBC, Wet Prep HPF POC <10 <10   Sperm NONE SEEN   ROM Plus ([facility] only)   Collection Time: 12/07/22 10:56 AM  Result Value Ref Range   Rom Plus NEGATIVE   Fetal fibronectin   Collection Time: 12/07/22 10:56 AM  Result Value Ref Range   Fetal Fibronectin NEGATIVE NEGATIVE    Treatments: none  Hospital Course:  This is a 32 y.o. G2P1001 with IUP at [redacted]w[redacted]d seen for LOF and pressure.  Labs were collected and noted above.  She was observed, fetal heart rate monitoring remained reassuring, and she had no signs/symptoms of preterm labor or other maternal-fetal concerns.  She was deemed stable for discharge to home with outpatient follow up and precaustions.  Discharge Physical Exam:  BP 121/80 (BP Location: Left Arm)   Pulse 98   Temp 98.7 F (37.1 C) (Oral)   Resp 18   Ht 5\' 9"  (1.753 m)   Wt 97.1 kg   LMP 04/27/2022   BMI 31.60 kg/m   General: NAD CV: RRR Pulm: nl effort ABD: s/nd/nt, gravid DVT Evaluation: LE non-ttp, no evidence of DVT on exam.  NST: FHR baseline: 140 bpm Variability: moderate Accelerations: yes Decelerations: none Category/reactivity:  reactive  TOCO: quiet SVE: deferred      Discharge Condition: Stable  Disposition:  Discharge disposition: 01-Home or Self Care        Allergies as of 12/07/2022   No Known Allergies      Medication List     TAKE these medications    acetaminophen 325 MG tablet Commonly known as: Tylenol Take 2 tablets (650 mg total) by mouth every 4 (four) hours as needed (for pain scale < 4).   calcium carbonate 500 MG chewable tablet Commonly known as: TUMS - dosed in mg elemental calcium Chew 1 tablet by mouth daily.   cyclobenzaprine 5 MG tablet Commonly known as: FLEXERIL Take 1 tablet (5 mg total) by mouth 3 (three) times daily as needed for muscle spasms.   famotidine 10 MG tablet Commonly known as: PEPCID Take 10 mg by mouth 2 (two) times daily.   ferrous sulfate 325 (65 FE) MG EC tablet Take 325 mg by mouth 2 (two) times daily.   fluticasone 50 MCG/ACT nasal spray Commonly known as: FLONASE Place 1 spray into both nostrils daily.   loratadine 10 MG tablet Commonly known as: CLARITIN Take 10 mg by mouth daily.   pantoprazole 40 MG tablet Commonly known as: PROTONIX Take 1 tablet (40 mg total) by mouth daily.   prenatal multivitamin Tabs tablet Take 1 tablet by mouth daily at 12 noon.   vitamin C 1000 MG  tablet Take 1,000 mg by mouth daily.         SignedLinda Hedges, CNM 12/07/2022 3:50 PM

## 2022-12-07 NOTE — OB Triage Note (Signed)
Pt [place] 32 y.o. presents to labor and delivery triage reporting leaking of fluid and pelvic pressure. Pt is a G2P1001 at [redacted]w[redacted]d . Pt denies active vaginal bleeding. Pt denies contractions and states positive fetal movement. External FM and TOCO applied to non-tender abdomen and assessing. Initial FHR 140. Vital signs obtained and within normal limits. Provider notified of pt.

## 2023-01-04 LAB — OB RESULTS CONSOLE RPR: RPR: NONREACTIVE

## 2023-01-04 LAB — OB RESULTS CONSOLE GC/CHLAMYDIA
Chlamydia: NEGATIVE
Neisseria Gonorrhea: NEGATIVE

## 2023-01-04 LAB — OB RESULTS CONSOLE HIV ANTIBODY (ROUTINE TESTING): HIV: NONREACTIVE

## 2023-01-04 LAB — OB RESULTS CONSOLE GBS: GBS: NEGATIVE

## 2023-01-17 ENCOUNTER — Encounter: Payer: Self-pay | Admitting: Obstetrics and Gynecology

## 2023-01-17 ENCOUNTER — Other Ambulatory Visit: Payer: Self-pay

## 2023-01-17 ENCOUNTER — Inpatient Hospital Stay
Admission: EM | Admit: 2023-01-17 | Discharge: 2023-01-19 | DRG: 807 | Disposition: A | Discharge status: Home / Self Care | Payer: BC Managed Care – PPO

## 2023-01-17 DIAGNOSIS — Z3A37 37 weeks gestation of pregnancy: Secondary | ICD-10-CM | POA: Diagnosis not present

## 2023-01-17 DIAGNOSIS — O9902 Anemia complicating childbirth: Secondary | ICD-10-CM | POA: Diagnosis present

## 2023-01-17 DIAGNOSIS — O26893 Other specified pregnancy related conditions, third trimester: Secondary | ICD-10-CM | POA: Diagnosis present

## 2023-01-17 DIAGNOSIS — O4290 Premature rupture of membranes, unspecified as to length of time between rupture and onset of labor, unspecified weeks of gestation: Principal | ICD-10-CM | POA: Diagnosis present

## 2023-01-17 LAB — CBC
HCT: 34.1 % — ABNORMAL LOW (ref 36.0–46.0)
Hemoglobin: 11.8 g/dL — ABNORMAL LOW (ref 12.0–15.0)
MCH: 32 pg (ref 26.0–34.0)
MCHC: 34.6 g/dL (ref 30.0–36.0)
MCV: 92.4 fL (ref 80.0–100.0)
Platelets: 180 10*3/uL (ref 150–400)
RBC: 3.69 MIL/uL — ABNORMAL LOW (ref 3.87–5.11)
RDW: 13.9 % (ref 11.5–15.5)
WBC: 10.4 10*3/uL (ref 4.0–10.5)
nRBC: 0 % (ref 0.0–0.2)

## 2023-01-17 LAB — TYPE AND SCREEN
ABO/RH(D): A POS
Antibody Screen: NEGATIVE

## 2023-01-17 MED ORDER — ONDANSETRON HCL 4 MG/2ML IJ SOLN: 4.0000 mg | Freq: Four times a day (QID) | INTRAMUSCULAR | Status: DC | PRN

## 2023-01-17 MED ORDER — OXYTOCIN-SODIUM CHLORIDE 30-0.9 UT/500ML-% IV SOLN
2.5000 [IU]/h | INTRAVENOUS | Status: DC
  Filled 2023-01-17: qty 500

## 2023-01-17 MED ORDER — OXYTOCIN BOLUS FROM INFUSION
333.0000 mL | Freq: Once | INTRAVENOUS | Status: AC
  Administered 2023-01-18: 333 mL via INTRAVENOUS

## 2023-01-17 MED ORDER — TERBUTALINE SULFATE 1 MG/ML IJ SOLN: 0.2500 mg | Freq: Once | INTRAMUSCULAR | Status: DC | PRN

## 2023-01-17 MED ORDER — ACETAMINOPHEN 325 MG PO TABS: 650.0000 mg | ORAL_TABLET | ORAL | Status: DC | PRN

## 2023-01-17 MED ORDER — OXYTOCIN-SODIUM CHLORIDE 30-0.9 UT/500ML-% IV SOLN
1.0000 m[IU]/min | INTRAVENOUS | Status: DC
  Administered 2023-01-18: 2 m[IU]/min via INTRAVENOUS

## 2023-01-17 MED ORDER — LIDOCAINE HCL (PF) 1 % IJ SOLN: 30.0000 mL | INTRAMUSCULAR | Status: DC | PRN

## 2023-01-17 MED ORDER — LACTATED RINGERS IV SOLN: INTRAVENOUS | Status: DC

## 2023-01-17 MED ORDER — SOD CITRATE-CITRIC ACID 500-334 MG/5ML PO SOLN: 30.0000 mL | ORAL | Status: DC | PRN

## 2023-01-17 MED ORDER — LACTATED RINGERS IV SOLN
500.0000 mL | INTRAVENOUS | Status: DC | PRN
  Administered 2023-01-17: 1000 mL via INTRAVENOUS

## 2023-01-17 MED ORDER — FENTANYL CITRATE (PF) 100 MCG/2ML IJ SOLN: 50.0000 ug | INTRAMUSCULAR | Status: DC | PRN

## 2023-01-17 NOTE — OB Triage Note (Signed)
Pt states membranes SROM at 1830. Pt reports that she has been having manageable ctx's.

## 2023-01-17 NOTE — H&P (Signed)
OB History & Physical   History of Present Illness:  Chief Complaint:   HPI:  Joanna Adams is a 32 y.o. G2P1001 female at [redacted]w[redacted]d dated by LMP.  She presents to L&D for SROM at 1830. She reports good fetal movement. Denies any contractions.    Pregnancy Issues: 1. History of pre-eclampsia 2. Anemia 3. Rubella and varicella non-immune 4. Elevated 1hr GTT   Maternal Medical History:  History reviewed. No pertinent past medical history.  Past Surgical History:  Procedure Laterality Date   LEEP  2016   NO PAST SURGERIES      No Known Allergies  Prior to Admission medications   Medication Sig Start Date End Date Taking? Authorizing Provider  Ascorbic Acid (VITAMIN C) 1000 MG tablet Take 1,000 mg by mouth daily.   Yes [provider]  famotidine (PEPCID) 10 MG tablet Take 10 mg by mouth 2 (two) times daily.   Yes [provider]  ferrous sulfate 325 (65 FE) MG EC tablet Take 325 mg by mouth 2 (two) times daily.   Yes [provider]  fluticasone (FLONASE) 50 MCG/ACT nasal spray Place 1 spray into both nostrils daily.   Yes [provider]  loratadine (CLARITIN) 10 MG tablet Take 10 mg by mouth daily.   Yes [provider]  Prenatal Vit-Fe Fumarate-FA (PRENATAL MULTIVITAMIN) TABS tablet Take 1 tablet by mouth daily at 12 noon.   Yes [provider]  acetaminophen (TYLENOL) 325 MG tablet Take 2 tablets (650 mg total) by mouth every 4 (four) hours as needed (for pain scale < 4). Patient not taking: Reported on 01/17/2023 02/22/21   Gustavo Lah, CNM  calcium carbonate (TUMS - DOSED IN MG ELEMENTAL CALCIUM) 500 MG chewable tablet Chew 1 tablet by mouth daily. Patient not taking: Reported on 11/22/2022    [provider]  cyclobenzaprine (FLEXERIL) 5 MG tablet Take 1 tablet (5 mg total) by mouth 3 (three) times daily as needed for muscle spasms. Patient not taking: Reported on 01/17/2023 11/22/22   McVey, Prudencio Pair, CNM   pantoprazole (PROTONIX) 40 MG tablet Take 1 tablet (40 mg total) by mouth daily. Patient not taking: Reported on 01/17/2023 11/23/22   McVey, Prudencio Pair, CNM    Prenatal care site: [facility]   Social History: She  reports that she has never smoked. She has never used smokeless tobacco. She reports current alcohol use. She reports that she does not use drugs.  Family History: family history is not on file.   Review of Systems: A full review of systems was performed and negative except as noted in the HPI.     Physical Exam:  Vital Signs: BP 129/89 (BP Location: Left Arm)   Pulse (!) 116   Temp 99.3 F (37.4 C) (Oral)   Resp 18   Ht 5\' 9"  (1.753 m)   Wt 98 kg   LMP 04/27/2022   BMI 31.90 kg/m  General: no acute distress.  HEENT: normocephalic, atraumatic Heart: regular rate & rhythm.  No murmurs/rubs/gallops Lungs: clear to auscultation bilaterally, normal respiratory effort Abdomen: soft, gravid, non-tender;  EFW: 7lb Pelvic:   External: Normal external female genitalia  Cervix: Dilation: 7 / Effacement (%): 90 / Station: -1    Extremities: non-tender, symmetric, mild edema bilaterally.  DTRs: +2  Neurologic: Alert & oriented x 3.    No results found for this or any previous visit (from the past 24 hour(s)).  Pertinent Results:  Prenatal Labs: Blood  type/Rh A pos  Antibody screen neg  Rubella Non-Immune  Varicella Non-Immune  RPR NR  HBsAg Neg  HIV NR  GC neg  Chlamydia neg  Genetic screening negative  1 hour GTT 137  3 hour GTT fasting 87, 1hr 165, 2hr 138, 3hr 13   GBS negative   FHT: 135bpm, moderate variability, accelerations TOCO: Contractions q3-51min, palpate moderate SVE:  Dilation: 7 / Effacement (%): 90 / Station: -1    Cephalic by leopolds  No results found.  Assessment:  Joanna Adams is a 32 y.o. G2P1001 female at [redacted]w[redacted]d with SROM and active labor.   Plan:  1. Admit to Labor & Delivery; consents reviewed and obtained - Dr.  Feliberto Gottron notified of admission  2. Fetal Well being  - Fetal Tracing: Category I tracing - Group B Streptococcus ppx indicated: n/a, GBS negative - Presentation: vertex confirmed by SVE   3. Routine OB: - Prenatal labs reviewed, as above - Rh pos - CBC, T&S, RPR on admit - Clear fluids, saline lock  4. Monitoring of Labor -  Contractions q3-40min, external toco in place -  Pelvis proven to 3850g -  Plan for continuous fetal monitoring  -  Maternal pain control as desired; requesting regional anesthesia - Anticipate vaginal delivery  5. Post Partum Planning: - Infant feeding: breastfeeding - Contraception: husband to get vasectomy  Janyce Llanos, CNM 01/17/23 7:30 PM

## 2023-01-17 NOTE — Progress Notes (Signed)
Labor Progress Note  Joanna Adams is a 32 y.o. G2P1001 at [redacted]w[redacted]d by LMP admitted for active labor, rupture of membranes  Subjective: she is comfortable, not feeling any contractions  Objective: BP 129/89 (BP Location: Left Arm)   Pulse (!) 116   Temp 98.7 F (37.1 C) (Axillary)   Resp 18   Ht 5\' 9"  (1.753 m)   Wt 98 kg   LMP 04/27/2022   BMI 31.90 kg/m  Notable VS details: reviewed  Fetal Assessment: FHT:  FHR: 135 bpm, variability: moderate,  accelerations:  Present,  decelerations:  Absent Category/reactivity:  Category I UC:   regular, every 7 minutes SVE:    Dilation: 7.5cm  Effacement: 80%  Station:  -1  Consistency: soft  Position: middle  Membrane status:SROM @ 1830 Amniotic color: clear  Labs: Lab Results  Component Value Date   WBC 10.4 01/17/2023   HGB 11.8 (L) 01/17/2023   HCT 34.1 (L) 01/17/2023   MCV 92.4 01/17/2023   PLT 180 01/17/2023    Assessment / Plan: 32 year old G2P1001 at [redacted]w[redacted]d here with SROM and active labor  Labor:  Minimal progress over the last few hours and contractions have spaced out. Discussed augmentation vs expectant management at this time and she requests augmentation.  Preeclampsia:  no signs or symptoms of toxicity Fetal Wellbeing:  Category I Pain Control:   requests to get epidural prior to starting pitocin I/D:  n/a Anticipated MOD:  NSVD  Janyce Llanos, CNM 01/17/2023, 11:26 PM

## 2023-01-18 ENCOUNTER — Inpatient Hospital Stay: Payer: BC Managed Care – PPO | Admitting: Anesthesiology

## 2023-01-18 ENCOUNTER — Encounter: Payer: Self-pay | Admitting: Obstetrics and Gynecology

## 2023-01-18 LAB — RPR: RPR Ser Ql: NONREACTIVE

## 2023-01-18 MED ORDER — ZOLPIDEM TARTRATE 5 MG PO TABS: 5.0000 mg | ORAL_TABLET | Freq: Every evening | ORAL | Status: DC | PRN

## 2023-01-18 MED ORDER — MEPERIDINE HCL 25 MG/ML IJ SOLN: 6.2500 mg | INTRAMUSCULAR | Status: DC | PRN

## 2023-01-18 MED ORDER — IBUPROFEN 600 MG PO TABS
600.0000 mg | ORAL_TABLET | Freq: Four times a day (QID) | ORAL | Status: DC
  Administered 2023-01-18 – 2023-01-19 (×4): 600 mg via ORAL
  Filled 2023-01-18 (×5): qty 1

## 2023-01-18 MED ORDER — LIDOCAINE HCL (PF) 1 % IJ SOLN
INTRAMUSCULAR | Status: DC | PRN
  Administered 2023-01-18: 3 mL

## 2023-01-18 MED ORDER — KETOROLAC TROMETHAMINE 30 MG/ML IJ SOLN: 30.0000 mg | Freq: Four times a day (QID) | INTRAMUSCULAR | Status: AC | PRN

## 2023-01-18 MED ORDER — ACETAMINOPHEN 325 MG PO TABS
650.0000 mg | ORAL_TABLET | ORAL | Status: DC | PRN
  Administered 2023-01-18 – 2023-01-19 (×2): 650 mg via ORAL
  Filled 2023-01-18 (×2): qty 2

## 2023-01-18 MED ORDER — LORATADINE 10 MG PO TABS
10.0000 mg | ORAL_TABLET | Freq: Every day | ORAL | Status: DC
  Administered 2023-01-18: 10 mg via ORAL
  Filled 2023-01-18 (×2): qty 1

## 2023-01-18 MED ORDER — SODIUM CHLORIDE 0.9% FLUSH: 3.0000 mL | INTRAVENOUS | Status: DC | PRN

## 2023-01-18 MED ORDER — NALOXONE HCL 0.4 MG/ML IJ SOLN: 0.4000 mg | INTRAMUSCULAR | Status: DC | PRN

## 2023-01-18 MED ORDER — SENNOSIDES-DOCUSATE SODIUM 8.6-50 MG PO TABS
2.0000 | ORAL_TABLET | Freq: Every day | ORAL | Status: DC
  Administered 2023-01-18: 2 via ORAL
  Filled 2023-01-18: qty 2

## 2023-01-18 MED ORDER — FLUTICASONE PROPIONATE 50 MCG/ACT NA SUSP: 1.0000 | Freq: Every day | NASAL | Status: DC | PRN

## 2023-01-18 MED ORDER — ONDANSETRON HCL 4 MG/2ML IJ SOLN: 4.0000 mg | Freq: Three times a day (TID) | INTRAMUSCULAR | Status: DC | PRN

## 2023-01-18 MED ORDER — DIBUCAINE (PERIANAL) 1 % EX OINT
1.0000 | TOPICAL_OINTMENT | CUTANEOUS | Status: DC | PRN
  Administered 2023-01-18: 1 via RECTAL
  Filled 2023-01-18: qty 28

## 2023-01-18 MED ORDER — BENZOCAINE-MENTHOL 20-0.5 % EX AERO
1.0000 | INHALATION_SPRAY | CUTANEOUS | Status: DC | PRN
  Administered 2023-01-18: 1 via TOPICAL
  Filled 2023-01-18: qty 56

## 2023-01-18 MED ORDER — FENTANYL-BUPIVACAINE-NACL 0.5-0.125-0.9 MG/250ML-% EP SOLN
EPIDURAL | Status: AC
  Filled 2023-01-18: qty 250

## 2023-01-18 MED ORDER — LACTATED RINGERS IV SOLN
500.0000 mL | Freq: Once | INTRAVENOUS | Status: AC
  Administered 2023-01-18: 500 mL via INTRAVENOUS

## 2023-01-18 MED ORDER — SENNOSIDES-DOCUSATE SODIUM 8.6-50 MG PO TABS: 2.0000 | ORAL_TABLET | Freq: Every day | ORAL | Status: DC

## 2023-01-18 MED ORDER — EPHEDRINE 5 MG/ML INJ: 10.0000 mg | INTRAVENOUS | Status: DC | PRN

## 2023-01-18 MED ORDER — WITCH HAZEL-GLYCERIN EX PADS
1.0000 | MEDICATED_PAD | CUTANEOUS | Status: DC | PRN
  Administered 2023-01-18: 1 via TOPICAL
  Filled 2023-01-18: qty 100

## 2023-01-18 MED ORDER — LIDOCAINE-EPINEPHRINE (PF) 1.5 %-1:200000 IJ SOLN
INTRAMUSCULAR | Status: DC | PRN
  Administered 2023-01-18: 3 mL via PERINEURAL

## 2023-01-18 MED ORDER — FENTANYL-BUPIVACAINE-NACL 0.5-0.125-0.9 MG/250ML-% EP SOLN
EPIDURAL | Status: DC | PRN
  Administered 2023-01-18: 12 mL/h via EPIDURAL

## 2023-01-18 MED ORDER — OXYCODONE HCL 5 MG PO TABS: 5.0000 mg | ORAL_TABLET | Freq: Four times a day (QID) | ORAL | Status: DC | PRN

## 2023-01-18 MED ORDER — DIPHENHYDRAMINE HCL 25 MG PO CAPS: 25.0000 mg | ORAL_CAPSULE | ORAL | Status: DC | PRN

## 2023-01-18 MED ORDER — NALOXONE HCL 4 MG/10ML IJ SOLN: 1.0000 ug/kg/h | INTRAVENOUS | Status: DC | PRN

## 2023-01-18 MED ORDER — SCOPOLAMINE 1 MG/3DAYS TD PT72: 1.0000 | MEDICATED_PATCH | Freq: Once | TRANSDERMAL | Status: DC

## 2023-01-18 MED ORDER — DIPHENHYDRAMINE HCL 50 MG/ML IJ SOLN: 12.5000 mg | INTRAMUSCULAR | Status: DC | PRN

## 2023-01-18 MED ORDER — ONDANSETRON HCL 4 MG PO TABS: 4.0000 mg | ORAL_TABLET | ORAL | Status: DC | PRN

## 2023-01-18 MED ORDER — PHENYLEPHRINE 80 MCG/ML (10ML) SYRINGE FOR IV PUSH (FOR BLOOD PRESSURE SUPPORT): 80.0000 ug | PREFILLED_SYRINGE | INTRAVENOUS | Status: DC | PRN

## 2023-01-18 MED ORDER — ONDANSETRON HCL 4 MG/2ML IJ SOLN: 4.0000 mg | INTRAMUSCULAR | Status: DC | PRN

## 2023-01-18 MED ORDER — SIMETHICONE 80 MG PO CHEW: 80.0000 mg | CHEWABLE_TABLET | ORAL | Status: DC | PRN

## 2023-01-18 MED ORDER — FENTANYL-BUPIVACAINE-NACL 0.5-0.125-0.9 MG/250ML-% EP SOLN: 12.0000 mL/h | EPIDURAL | Status: DC | PRN

## 2023-01-18 MED ORDER — BUPIVACAINE HCL (PF) 0.25 % IJ SOLN
INTRAMUSCULAR | Status: DC | PRN
  Administered 2023-01-18 (×2): 5 mL via EPIDURAL

## 2023-01-18 MED ORDER — COCONUT OIL OIL: 1.0000 | TOPICAL_OIL | Status: DC | PRN

## 2023-01-18 MED ORDER — DIPHENHYDRAMINE HCL 25 MG PO CAPS: 25.0000 mg | ORAL_CAPSULE | Freq: Four times a day (QID) | ORAL | Status: DC | PRN

## 2023-01-18 MED ORDER — PRENATAL MULTIVITAMIN CH
1.0000 | ORAL_TABLET | Freq: Every day | ORAL | Status: DC
  Administered 2023-01-18: 1 via ORAL
  Filled 2023-01-18: qty 1

## 2023-01-18 NOTE — Discharge Summary (Signed)
Obstetrical Discharge Summary  Patient Name: Joanna Adams DOB: 1991/04/20 MRN: [id number]  Date of Admission: 01/17/2023 Date of Delivery: 01/18/23 Delivered by: Donato Schultz, CNM Date of Discharge: 01/19/2023  Primary OB: [facility]  WUJ:[id number]'W last menstrual period was 04/27/2022. EDC Estimated Date of Delivery: 02/01/23 Gestational Age at Delivery: [redacted]w[redacted]d   Antepartum complications:  1. History of pre-eclampsia 2. Anemia 3. Rubella and varicella non-immune 4. Elevated 1hr GTT Admitting Diagnosis: uterine contractions, SROM Secondary Diagnosis: NSVD Patient Active Problem List   Diagnosis Date Noted   Amniotic fluid leaking 12/07/2022   Left flank pain 11/22/2022   NSVD (normal spontaneous vaginal delivery) 02/21/2021   Gestational hypertension 02/07/2021    Augmentation: Pitocin Complications: None Intrapartum complications/course: She arrived with uterine contractions and was found to be 7/80/-1. She was augmented with low-dose pitocin up to 93mu/min and progressed to 10/100/+3 and pushed 2 contractions, delivering viable female infant over 2nd degree laceration. Apgars 8/9. Date of Delivery: 01/18/23 Delivered By: Donato Schultz, CNM Delivery Type: spontaneous vaginal delivery Anesthesia: epidural Placenta: spontaneous Laceration: 2nd degree, repaired with 2-0 Vicryl Episiotomy: none Newborn Data: Live born female "Chapel" Birth Weight:  8lbs 11.7oz APGAR: 8, 9  Newborn Delivery   Birth date/time: 01/18/2023 02:46:00 Delivery type: Vaginal, Spontaneous     Postpartum Procedures: None  Edinburgh:     01/18/2023    6:56 PM 02/21/2021    4:35 PM  Edinburgh Postnatal Depression Scale Screening Tool  I have been able to laugh and see the funny side of things. 0 0  I have looked forward with enjoyment to things. 0 0  I have blamed myself unnecessarily when things went wrong. 1 1  I have been anxious or worried for no good reason. 1 2  I have felt  scared or panicky for no good reason. 1 0  Things have been getting on top of me. 0 0  I have been so unhappy that I have had difficulty sleeping. 0 0  I have felt sad or miserable. 0 0  I have been so unhappy that I have been crying. 0 0  The thought of harming myself has occurred to me. 0 0  Edinburgh Postnatal Depression Scale Total 3 3      Post partum course:  Patient had an uncomplicated postpartum course.  By time of discharge on PPD#1, her pain was controlled on oral pain medications; she had appropriate lochia and was ambulating, voiding without difficulty and tolerating regular diet.  She was deemed stable for discharge to home.    Discharge Physical Exam:  BP 101/70 (BP Location: Left Arm)   Pulse 77   Temp 98 F (36.7 C) (Oral)   Resp 18   Ht 5\' 9"  (1.753 m)   Wt 98 kg   LMP 04/27/2022   SpO2 99%   Breastfeeding Unknown   BMI 31.90 kg/m   General: NAD CV: RRR Pulm: CTABL, nl effort ABD: s/nd/nt, fundus firm and below the umbilicus Lochia: moderate Perineum: well approximated/intact DVT Evaluation: LE non-ttp, no evidence of DVT on exam.  Hemoglobin  Date Value Ref Range Status  01/19/2023 9.2 (L) 12.0 - 15.0 g/dL Final   HCT  Date Value Ref Range Status  01/19/2023 27.5 (L) 36.0 - 46.0 % Final     Disposition: stable, discharge to home. Baby Feeding: formula Baby Disposition: home with mom  Rh Immune globulin given: n/a, A positive Rubella vaccine given: declined  Varicella vaccine given: declined  Tdap vaccine given  in AP or PP setting: declined Flu vaccine given in AP or PP setting: declined  Contraception: husband to get vasectomy, condoms in the interim   Prenatal Labs:  Blood type/Rh A pos  Antibody screen neg  Rubella Non-Immune  Varicella Non-Immune  RPR NR  HBsAg Neg  HIV NR  GC neg  Chlamydia neg  Genetic screening negative  1 hour GTT 137  3 hour GTT fasting 87, 1hr 165, 2hr 138, 3hr 13   GBS negative     Plan:  [place]  Noreli Freyman was discharged to home in good condition. Follow-up appointment with delivering provider in 6 weeks.  Discharge Medications: Allergies as of 01/19/2023   No Known Allergies      Medication List     STOP taking these medications    calcium carbonate 500 MG chewable tablet Commonly known as: TUMS - dosed in mg elemental calcium   cyclobenzaprine 5 MG tablet Commonly known as: FLEXERIL   pantoprazole 40 MG tablet Commonly known as: PROTONIX       TAKE these medications    acetaminophen 325 MG tablet Commonly known as: Tylenol Take 2 tablets (650 mg total) by mouth every 4 (four) hours as needed (for pain scale < 4).   famotidine 10 MG tablet Commonly known as: PEPCID Take 10 mg by mouth 2 (two) times daily.   ferrous sulfate 325 (65 FE) MG EC tablet Take 325 mg by mouth 2 (two) times daily.   fluticasone 50 MCG/ACT nasal spray Commonly known as: FLONASE Place 1 spray into both nostrils daily.   ibuprofen 600 MG tablet Commonly known as: ADVIL Take 1 tablet (600 mg total) by mouth every 6 (six) hours as needed for mild pain or cramping.   loratadine 10 MG tablet Commonly known as: CLARITIN Take 10 mg by mouth daily.   prenatal multivitamin Tabs tablet Take 1 tablet by mouth daily at 12 noon.   vitamin C 1000 MG tablet Take 1,000 mg by mouth daily.         Follow-up Information     Janyce Llanos, CNM Follow up in 6 week(s).   Specialty: Certified Nurse Midwife Why: 6wk postpartum Contact information: [address] [place] [address] [phone]                 Signed:  Al Corpus 01/19/2023 8:59 AM  Margaretmary Eddy, CNM Certified Nurse Midwife [facility]

## 2023-01-18 NOTE — Progress Notes (Signed)
Pt moving arm

## 2023-01-18 NOTE — Anesthesia Preprocedure Evaluation (Signed)
Anesthesia Evaluation  Patient identified by MRN, date of birth, ID band Patient awake    Reviewed: Allergy & Precautions, NPO status , Patient's Chart, lab work & pertinent test results  History of Anesthesia Complications Negative for: history of anesthetic complications  Airway Mallampati: III  TM Distance: >3 FB Neck ROM: full    Dental no notable dental hx.    Pulmonary neg pulmonary ROS   Pulmonary exam normal        Cardiovascular Exercise Tolerance: Good negative cardio ROS Normal cardiovascular exam     Neuro/Psych    GI/Hepatic negative GI ROS,,,  Endo/Other    Renal/GU   negative genitourinary   Musculoskeletal   Abdominal   Peds  Hematology negative hematology ROS (+)   Anesthesia Other Findings History reviewed. No pertinent past medical history.  Past Surgical History: 2016: LEEP No date: NO PAST SURGERIES  BMI    Body Mass Index: 31.90 kg/m      Reproductive/Obstetrics (+) Pregnancy                             Anesthesia Physical Anesthesia Plan  ASA: 2  Anesthesia Plan: Epidural   Post-op Pain Management:    Induction:   PONV Risk Score and Plan:   Airway Management Planned:   Additional Equipment:   Intra-op Plan:   Post-operative Plan:   Informed Consent: I have reviewed the patients History and Physical, chart, labs and discussed the procedure including the risks, benefits and alternatives for the proposed anesthesia with the patient or authorized representative who has indicated his/her understanding and acceptance.       Plan Discussed with: Anesthesiologist  Anesthesia Plan Comments:        Anesthesia Quick Evaluation

## 2023-01-18 NOTE — Anesthesia Procedure Notes (Signed)
Epidural Patient location during procedure: OB Start time: 01/18/2023 12:39 AM End time: 01/18/2023 12:44 AM  Staffing Anesthesiologist: Louie Boston, MD Performed: anesthesiologist   Preanesthetic Checklist Completed: patient identified, IV checked, site marked, risks and benefits discussed, surgical consent, monitors and equipment checked, pre-op evaluation and timeout performed  Epidural Patient position: sitting Prep: ChloraPrep Patient monitoring: heart rate, continuous pulse ox and blood pressure Approach: midline Location: L3-L4 Injection technique: LOR saline  Needle:  Needle type: Tuohy  Needle gauge: 17 G Needle length: 9 cm and 9 Needle insertion depth: 6 cm Catheter type: closed end flexible Catheter size: 19 Gauge Catheter at skin depth: 11 cm Test dose: negative and 1.5% lidocaine with Epi 1:200 K  Assessment Sensory level: T10 Events: blood not aspirated, injection not painful, no injection resistance, no paresthesia and negative IV test  Additional Notes 1 attempt Pt. Evaluated and documentation done after procedure finished. Patient identified. Risks/Benefits/Options discussed with patient including but not limited to bleeding, infection, nerve damage, paralysis, failed block, incomplete pain control, headache, blood pressure changes, nausea, vomiting, reactions to medication both or allergic, itching and postpartum back pain. Confirmed with bedside nurse the patient's most recent platelet count. Confirmed with patient that they are not currently taking any anticoagulation, have any bleeding history or any family history of bleeding disorders. Patient expressed understanding and wished to proceed. All questions were answered. Sterile technique was used throughout the entire procedure. Please see nursing notes for vital signs. Test dose was given through epidural catheter and negative prior to continuing to dose epidural or start infusion. Warning signs of high  block given to the patient including shortness of breath, tingling/numbness in hands, complete motor block, or any concerning symptoms with instructions to call for help. Patient was given instructions on fall risk and not to get out of bed. All questions and concerns addressed with instructions to call with any issues or inadequate analgesia.    Patient tolerated the insertion well without immediate complications. Reason for block:procedure for pain

## 2023-01-18 NOTE — Anesthesia Postprocedure Evaluation (Signed)
Anesthesia Post Note  Patient: Joanna Adams  Procedure(s) Performed: AN AD HOC LABOR EPIDURAL  Patient location during evaluation: Mother Baby Anesthesia Type: Epidural Level of consciousness: oriented and awake and alert Pain management: pain level controlled Vital Signs Assessment: post-procedure vital signs reviewed and stable Respiratory status: spontaneous breathing and respiratory function stable Cardiovascular status: blood pressure returned to baseline and stable Postop Assessment: no headache, no backache, no apparent nausea or vomiting and able to ambulate Anesthetic complications: no  No notable events documented.   Last Vitals:  Vitals:   01/18/23 0602 01/18/23 0730  BP: 119/75 104/64  Pulse: 98 93  Resp: 18 18  Temp: 36.7 C 36.6 C  SpO2: 99% 98%    Last Pain:  Vitals:   01/18/23 0730  TempSrc: Oral  PainSc:                  Joanna Adams

## 2023-01-19 LAB — CBC
HCT: 27.5 % — ABNORMAL LOW (ref 36.0–46.0)
Hemoglobin: 9.2 g/dL — ABNORMAL LOW (ref 12.0–15.0)
MCH: 31.5 pg (ref 26.0–34.0)
MCHC: 33.5 g/dL (ref 30.0–36.0)
MCV: 94.2 fL (ref 80.0–100.0)
Platelets: 143 10*3/uL — ABNORMAL LOW (ref 150–400)
RBC: 2.92 MIL/uL — ABNORMAL LOW (ref 3.87–5.11)
RDW: 14.2 % (ref 11.5–15.5)
WBC: 9.9 10*3/uL (ref 4.0–10.5)
nRBC: 0 % (ref 0.0–0.2)

## 2023-01-19 MED ORDER — IBUPROFEN 600 MG PO TABS: 600.0000 mg | ORAL_TABLET | Freq: Four times a day (QID) | ORAL | 0 refills | Status: AC | PRN

## 2023-01-19 MED ORDER — ACETAMINOPHEN 325 MG PO TABS: 650.0000 mg | ORAL_TABLET | ORAL | Status: AC | PRN

## 2023-01-19 NOTE — Progress Notes (Signed)
Provided Vaccine Info Sheets for MMR and Varicella. Patient states she will review but will likely decline booster in the hospital.

## 2023-01-19 NOTE — Discharge Instructions (Signed)
Vaginal Delivery, Care After Refer to this sheet in the next few weeks. These discharge instructions provide you with information on caring for yourself after delivery. Your caregiver may also give you specific instructions. Your treatment has been planned according to the most current medical practices available, but problems sometimes occur. Call your caregiver if you have any problems or questions after you go home. HOME CARE INSTRUCTIONS Take over-the-counter or prescription medicines only as directed by your caregiver or pharmacist. Do not drink alcohol, especially if you are breastfeeding or taking medicine to relieve pain. Do not smoke tobacco. Continue to use good perineal care. Good perineal care includes: Wiping your perineum from back to front Keeping your perineum clean. You can do sitz baths twice a day, to help keep this area clean Do not use tampons, douche or have sex until your caregiver says it is okay. Shower only and avoid sitting in submerged water, aside from sitz baths Wear a well-fitting bra that provides breast support. Eat healthy foods. Drink enough fluids to keep your urine clear or pale yellow. Eat high-fiber foods such as whole grain cereals and breads, brown rice, beans, and fresh fruits and vegetables every day. These foods may help prevent or relieve constipation. Avoid constipation with high fiber foods or medications, such as miralax or metamucil Follow your caregiver's recommendations regarding resumption of activities such as climbing stairs, driving, lifting, exercising, or traveling. Talk to your caregiver about resuming sexual activities. Resumption of sexual activities is dependent upon your risk of infection, your rate of healing, and your comfort and desire to resume sexual activity. Try to have someone help you with your household activities and your newborn for at least a few days after you leave the hospital. Rest as much as possible. Try to rest or  take a nap when your newborn is sleeping. Increase your activities gradually. Keep all of your scheduled postpartum appointments. It is very important to keep your scheduled follow-up appointments. At these appointments, your caregiver will be checking to make sure that you are healing physically and emotionally. SEEK MEDICAL CARE IF:  You are passing large clots from your vagina. Save any clots to show your caregiver. You have a foul smelling discharge from your vagina. You have trouble urinating. You are urinating frequently. You have pain when you urinate. You have a change in your bowel movements. You have increasing redness, pain, or swelling near your vaginal incision (episiotomy) or vaginal tear. You have pus draining from your episiotomy or vaginal tear. Your episiotomy or vaginal tear is separating. You have painful, hard, or reddened breasts. You have a severe headache. You have blurred vision or see spots. You feel sad or depressed. You have thoughts of hurting yourself or your newborn. You have questions about your care, the care of your newborn, or medicines. You are dizzy or light-headed. You have a rash. You have nausea or vomiting. You were breastfeeding and have not had a menstrual period within 12 weeks after you stopped breastfeeding. You are not breastfeeding and have not had a menstrual period by the 12th week after delivery. You have a fever. SEEK IMMEDIATE MEDICAL CARE IF:  You have persistent pain. You have chest pain. You have shortness of breath. You faint. You have leg pain. You have stomach pain. Your vaginal bleeding saturates two or more sanitary pads in 1 hour. MAKE SURE YOU:  Understand these instructions. Will watch your condition. Will get help right away if you are not doing well or   get worse. Document Released: 09/01/2000 Document Revised: 01/19/2014 Document Reviewed: 05/01/2012 Exit[employer] Patient Information 2015 [employer]. This  information is not intended to replace advice given to you by your health care provider. Make sure you discuss any questions you have with your health care provider.  Sitz Bath A sitz bath is a warm water bath taken in the sitting position. The water covers only the hips and butt (buttocks). We recommend using one that fits in the toilet, to help with ease of use and cleanliness. It may be used for either healing or cleaning purposes. Sitz baths are also used to relieve pain, itching, or muscle tightening (spasms). The water may contain medicine. Moist heat will help you heal and relax.  HOME CARE  Take 3 to 4 sitz baths a day. Fill the bathtub half-full with warm water. Sit in the water and open the drain a little. Turn on the warm water to keep the tub half-full. Keep the water running constantly. Soak in the water for 15 to 20 minutes. After the sitz bath, pat the affected area dry. GET HELP RIGHT AWAY IF: You get worse instead of better. Stop the sitz baths if you get worse. MAKE SURE YOU: Understand these instructions. Will watch your condition. Will get help right away if you are not doing well or get worse. Document Released: 10/12/2004 Document Revised: 05/29/2012 Document Reviewed: 01/02/2011 Exit[employer] Patient Information 2015 [employer]. This information is not intended to replace advice given to you by your health care provider. Make sure you discuss any questions you have with your health care provider.   

## 2023-01-19 NOTE — Progress Notes (Signed)
Pt discharged with infant. Discharge instructions, prescriptions, and follow up appointments given to and reviewed with patient. Pt verbalized understanding. To be escorted out by auxillary.  °

## 2023-02-22 ENCOUNTER — Telehealth: Payer: Self-pay

## 2023-02-22 NOTE — Telephone Encounter (Signed)
